# Patient Record
Sex: Female | Born: 1975 | Race: Black or African American | Hispanic: No | Marital: Single | Smoking: Current every day smoker
Health system: Southern US, Community
[De-identification: ages and names within clinical notes are randomized; demographics above are authoritative.]

## PROBLEM LIST (undated history)

## (undated) DIAGNOSIS — F419 Anxiety disorder, unspecified: Secondary | ICD-10-CM

## (undated) DIAGNOSIS — F329 Major depressive disorder, single episode, unspecified: Secondary | ICD-10-CM

## (undated) DIAGNOSIS — F32A Depression, unspecified: Secondary | ICD-10-CM

## (undated) DIAGNOSIS — K219 Gastro-esophageal reflux disease without esophagitis: Secondary | ICD-10-CM

## (undated) HISTORY — DX: Major depressive disorder, single episode, unspecified: F32.9

## (undated) HISTORY — PX: CYST REMOVAL HAND: SHX6279

## (undated) HISTORY — DX: Anxiety disorder, unspecified: F41.9

## (undated) HISTORY — DX: Depression, unspecified: F32.A

## (undated) HISTORY — PX: TUBAL LIGATION: SHX77

## (undated) HISTORY — DX: Gastro-esophageal reflux disease without esophagitis: K21.9

---

## 1998-07-05 ENCOUNTER — Encounter: Admission: RE | Admit: 1998-07-05 | Discharge: 1998-07-05 | Payer: Self-pay | Admitting: Family Medicine

## 1998-07-19 ENCOUNTER — Encounter: Admission: RE | Admit: 1998-07-19 | Discharge: 1998-07-19 | Payer: Self-pay | Admitting: Sports Medicine

## 1998-11-17 ENCOUNTER — Emergency Department (HOSPITAL_COMMUNITY): Admission: EM | Admit: 1998-11-17 | Discharge: 1998-11-17 | Payer: Self-pay | Admitting: Emergency Medicine

## 1999-04-25 ENCOUNTER — Emergency Department (HOSPITAL_COMMUNITY): Admission: EM | Admit: 1999-04-25 | Discharge: 1999-04-25 | Payer: Self-pay | Admitting: Emergency Medicine

## 1999-06-21 ENCOUNTER — Emergency Department (HOSPITAL_COMMUNITY): Admission: EM | Admit: 1999-06-21 | Discharge: 1999-06-21 | Payer: Self-pay | Admitting: Emergency Medicine

## 2000-09-12 ENCOUNTER — Encounter: Admission: RE | Admit: 2000-09-12 | Discharge: 2000-09-12 | Payer: Self-pay | Admitting: Family Medicine

## 2001-04-22 ENCOUNTER — Inpatient Hospital Stay (HOSPITAL_COMMUNITY): Admission: AD | Admit: 2001-04-22 | Discharge: 2001-04-22 | Payer: Self-pay | Admitting: *Deleted

## 2002-06-30 ENCOUNTER — Inpatient Hospital Stay (HOSPITAL_COMMUNITY): Admission: AD | Admit: 2002-06-30 | Discharge: 2002-06-30 | Payer: Self-pay | Admitting: *Deleted

## 2002-09-30 ENCOUNTER — Emergency Department (HOSPITAL_COMMUNITY): Admission: EM | Admit: 2002-09-30 | Discharge: 2002-10-01 | Payer: Self-pay | Admitting: Emergency Medicine

## 2002-11-16 ENCOUNTER — Encounter: Admission: RE | Admit: 2002-11-16 | Discharge: 2002-11-16 | Payer: Self-pay | Admitting: Family Medicine

## 2003-02-09 ENCOUNTER — Other Ambulatory Visit: Admission: RE | Admit: 2003-02-09 | Discharge: 2003-02-09 | Payer: Self-pay | Admitting: Obstetrics and Gynecology

## 2003-02-12 ENCOUNTER — Encounter: Admission: RE | Admit: 2003-02-12 | Discharge: 2003-02-12 | Payer: Self-pay | Admitting: Family Medicine

## 2003-06-25 ENCOUNTER — Emergency Department (HOSPITAL_COMMUNITY): Admission: EM | Admit: 2003-06-25 | Discharge: 2003-06-25 | Payer: Self-pay | Admitting: Emergency Medicine

## 2003-10-18 ENCOUNTER — Other Ambulatory Visit: Admission: RE | Admit: 2003-10-18 | Discharge: 2003-10-18 | Payer: Self-pay | Admitting: Obstetrics and Gynecology

## 2004-01-20 ENCOUNTER — Inpatient Hospital Stay (HOSPITAL_COMMUNITY): Admission: AD | Admit: 2004-01-20 | Discharge: 2004-01-20 | Payer: Self-pay | Admitting: Obstetrics and Gynecology

## 2004-04-30 ENCOUNTER — Inpatient Hospital Stay (HOSPITAL_COMMUNITY): Admission: AD | Admit: 2004-04-30 | Discharge: 2004-05-01 | Payer: Self-pay | Admitting: Obstetrics

## 2004-05-02 ENCOUNTER — Inpatient Hospital Stay (HOSPITAL_COMMUNITY): Admission: AD | Admit: 2004-05-02 | Discharge: 2004-05-04 | Payer: Self-pay | Admitting: Obstetrics

## 2004-05-03 ENCOUNTER — Encounter (INDEPENDENT_AMBULATORY_CARE_PROVIDER_SITE_OTHER): Payer: Self-pay | Admitting: *Deleted

## 2008-11-28 ENCOUNTER — Emergency Department (HOSPITAL_COMMUNITY): Admission: EM | Admit: 2008-11-28 | Discharge: 2008-11-29 | Payer: Self-pay | Admitting: Emergency Medicine

## 2011-02-14 ENCOUNTER — Other Ambulatory Visit (HOSPITAL_COMMUNITY)
Admission: RE | Admit: 2011-02-14 | Discharge: 2011-02-14 | Disposition: A | Discharge status: Home / Self Care | Payer: BC Managed Care – PPO | Source: Ambulatory Visit | Attending: Family Medicine | Admitting: Family Medicine

## 2011-02-14 ENCOUNTER — Other Ambulatory Visit: Payer: Self-pay | Admitting: Family Medicine

## 2011-02-14 ENCOUNTER — Encounter: Payer: Self-pay | Admitting: Family Medicine

## 2011-02-14 ENCOUNTER — Ambulatory Visit (INDEPENDENT_AMBULATORY_CARE_PROVIDER_SITE_OTHER): Payer: BC Managed Care – PPO | Admitting: Family Medicine

## 2011-02-14 DIAGNOSIS — E785 Hyperlipidemia, unspecified: Secondary | ICD-10-CM

## 2011-02-14 DIAGNOSIS — Z0000 Encounter for general adult medical examination without abnormal findings: Secondary | ICD-10-CM

## 2011-02-14 DIAGNOSIS — Z23 Encounter for immunization: Secondary | ICD-10-CM

## 2011-02-14 DIAGNOSIS — R7309 Other abnormal glucose: Secondary | ICD-10-CM

## 2011-02-14 DIAGNOSIS — F17200 Nicotine dependence, unspecified, uncomplicated: Secondary | ICD-10-CM | POA: Insufficient documentation

## 2011-02-14 DIAGNOSIS — Z01419 Encounter for gynecological examination (general) (routine) without abnormal findings: Secondary | ICD-10-CM | POA: Insufficient documentation

## 2011-02-14 LAB — CBC WITH DIFFERENTIAL/PLATELET
Basophils Absolute: 0 10*3/uL (ref 0.0–0.1)
Basophils Relative: 0.5 % (ref 0.0–3.0)
Eosinophils Absolute: 0.1 10*3/uL (ref 0.0–0.7)
HCT: 37.4 % (ref 36.0–46.0)
Hemoglobin: 12.7 g/dL (ref 12.0–15.0)
Lymphocytes Relative: 45.8 % (ref 12.0–46.0)
Lymphs Abs: 3.2 10*3/uL (ref 0.7–4.0)
MCHC: 33.8 g/dL (ref 30.0–36.0)
MCV: 90.2 fl (ref 78.0–100.0)
Monocytes Absolute: 0.5 10*3/uL (ref 0.1–1.0)
Neutro Abs: 3.1 10*3/uL (ref 1.4–7.7)
RBC: 4.15 Mil/uL (ref 3.87–5.11)
RDW: 13.3 % (ref 11.5–14.6)

## 2011-02-14 LAB — CONVERTED CEMR LAB
Glucose, Urine, Semiquant: NEGATIVE
Protein, U semiquant: NEGATIVE
Urobilinogen, UA: 0.2
WBC Urine, dipstick: NEGATIVE
pH: 5

## 2011-02-14 LAB — MICROALBUMIN / CREATININE URINE RATIO
Creatinine,U: 257.7 mg/dL
Microalb Creat Ratio: 0.4 mg/g (ref 0.0–30.0)

## 2011-02-14 LAB — TSH: TSH: 2.25 u[IU]/mL (ref 0.35–5.50)

## 2011-02-14 LAB — LIPID PANEL
Total CHOL/HDL Ratio: 6
Triglycerides: 110 mg/dL (ref 0.0–149.0)

## 2011-02-14 LAB — HEPATIC FUNCTION PANEL
Albumin: 4 g/dL (ref 3.5–5.2)
Alkaline Phosphatase: 46 U/L (ref 39–117)
Total Protein: 6.9 g/dL (ref 6.0–8.3)

## 2011-02-14 LAB — BASIC METABOLIC PANEL
CO2: 28 mEq/L (ref 19–32)
Calcium: 9 mg/dL (ref 8.4–10.5)
Chloride: 101 mEq/L (ref 96–112)
Glucose, Bld: 77 mg/dL (ref 70–99)
Potassium: 3.9 mEq/L (ref 3.5–5.1)
Sodium: 136 mEq/L (ref 135–145)

## 2011-02-16 DIAGNOSIS — R8789 Other abnormal findings in specimens from female genital organs: Secondary | ICD-10-CM | POA: Insufficient documentation

## 2011-02-20 NOTE — Assessment & Plan Note (Signed)
Summary: new to estab/cbs   Vital Signs:  Patient profile:   35 year old female Menstrual status:  regular LMP:     02/07/2011 Height:      65 inches Weight:      183.4 pounds BMI:     30.63 Pulse rate:   76 / minute Pulse rhythm:   regular BP sitting:   132 / 80  (left arm) Cuff size:   large  Vitals Entered By: Almeta Monas CMA Duncan Dull) (February 14, 2011 10:39 AM) CC: New Est CPX with pap---wants to be tested for Diabetes  and Hyperlipidemia per pt--labs from work have been abnl LMP (date): 02/07/2011     Menstrual Status regular Enter LMP: 02/07/2011   History of Present Illness: Pt is here for cpe and pap.  Pt has hx elevated glucose and cholesterol.  Pt has been drinking a lot more water and going to [place] a lot.    Preventive Screening-Counseling & Management  Alcohol-Tobacco     Alcohol drinks/day: <1     Alcohol type: wine     Smoking Status: current     Smoking Cessation Counseling: yes     Smoke Cessation Stage: not ready     Packs/Day: 1.0     Year Started: 2004  Caffeine-Diet-Exercise     Caffeine use/day: 1     Does Patient Exercise: no  Hep-HIV-STD-Contraception     Dental Visit-last 6 months no     SBE monthly: yes     SBE Education/Counseling: not indicated; SBE done regularly      Sexual History:  not currently sexually active.        Drug Use:  no.    Current Medications (verified): 1)  None  Allergies (verified): No Known Drug Allergies  Past History:  Family History: Last updated: 02/14/2011 Family History Diabetes 1st degree relative-- paternal GM Family History Lung cancer-- Maternal GM  Social History: Last updated: 02/14/2011 Occupation:  [employer]  Single Current Smoker Alcohol use-yes Drug use-no Regular exercise-no  Risk Factors: Alcohol Use: <1 (02/14/2011) Caffeine Use: 1 (02/14/2011) Exercise: no (02/14/2011)  Risk Factors: Smoking Status: current (02/14/2011) Packs/Day: 1.0 (02/14/2011)  Past Medical  History: Current Problems:  PREVENTIVE HEALTH CARE (ICD-V70.0) HYPERLIPIDEMIA (ICD-272.4) HYPERGLYCEMIA, FASTING (ICD-790.29) FAMILY HISTORY DIABETES 1ST DEGREE RELATIVE (ICD-V18.0)  Past Surgical History: Tubal ligation  Family History: Reviewed history and no changes required. Family History Diabetes 1st degree relative-- paternal GM Family History Lung cancer-- Maternal GM  Social History: Reviewed history and no changes required. Occupation:  [employer]  Single Current Smoker Alcohol use-yes Drug use-no Regular exercise-no Occupation:  employed Smoking Status:  current Drug Use:  no Does Patient Exercise:  no Packs/Day:  1.0 Caffeine use/day:  1 Dental Care w/in 6 mos.:  no Sexual History:  not currently sexually active  Review of Systems      See HPI General:  Denies chills, fatigue, fever, loss of appetite, malaise, sleep disorder, sweats, weakness, and weight loss. Eyes:  Denies blurring, discharge, double vision, eye irritation, eye pain, halos, itching, light sensitivity, red eye, vision loss-1 eye, and vision loss-both eyes; optho--q1y. ENT:  Denies decreased hearing, difficulty swallowing, ear discharge, earache, hoarseness, nasal congestion, nosebleeds, postnasal drainage, ringing in ears, sinus pressure, and sore throat. CV:  Denies bluish discoloration of lips or nails, chest pain or discomfort, difficulty breathing at night, difficulty breathing while lying down, fainting, fatigue, leg cramps with exertion, lightheadness, near fainting, palpitations, shortness of breath with exertion, swelling of  feet, swelling of hands, and weight gain. Resp:  Denies chest discomfort, chest pain with inspiration, cough, coughing up blood, excessive snoring, hypersomnolence, morning headaches, pleuritic, shortness of breath, sputum productive, and wheezing. GI:  Denies abdominal pain, bloody stools, change in bowel habits, constipation, dark tarry stools, diarrhea,  excessive appetite, gas, hemorrhoids, indigestion, loss of appetite, nausea, vomiting, vomiting blood, and yellowish skin color. GU:  Complains of urinary frequency; denies abnormal vaginal bleeding, decreased libido, discharge, dysuria, genital sores, hematuria, incontinence, nocturia, and urinary hesitancy. MS:  Denies joint pain, joint redness, joint swelling, loss of strength, low back pain, mid back pain, muscle aches, muscle , cramps, muscle weakness, stiffness, and thoracic pain. Derm:  Denies changes in color of skin, changes in nail beds, dryness, excessive perspiration, flushing, hair loss, insect bite(s), itching, lesion(s), poor wound healing, and rash. Neuro:  Denies brief paralysis, difficulty with concentration, disturbances in coordination, falling down, headaches, inability to speak, memory loss, numbness, poor balance, seizures, sensation of room spinning, tingling, tremors, visual disturbances, and weakness. Psych:  Denies alternate hallucination ( auditory/visual), anxiety, depression, easily angered, easily tearful, irritability, mental problems, panic attacks, sense of great danger, suicidal thoughts/plans, thoughts of violence, unusual visions or sounds, and thoughts /plans of harming others. Endo:  Complains of excessive thirst and excessive urination; denies cold intolerance, excessive hunger, heat intolerance, polyuria, and weight change. Heme:  Denies abnormal bruising, bleeding, enlarge lymph nodes, fevers, pallor, and skin discoloration. Allergy:  Denies hives or rash, itching eyes, persistent infections, seasonal allergies, and sneezing.  Physical Exam  General:  Well-developed,well-nourished,in no acute distress; alert,appropriate and cooperative throughout examination Head:  Normocephalic and atraumatic without obvious abnormalities. No apparent alopecia or balding. Eyes:  pupils equal, pupils round, pupils reactive to light, and no injection.   Ears:  External ear exam  shows no significant lesions or deformities.  Otoscopic examination reveals clear canals, tympanic membranes are intact bilaterally without bulging, retraction, inflammation or discharge. Hearing is grossly normal bilaterally. Nose:  External nasal examination shows no deformity or inflammation. Nasal mucosa are pink and moist without lesions or exudates. Mouth:  Oral mucosa and oropharynx without lesions or exudates.  Teeth in good repair. Neck:  No deformities, masses, or tenderness noted. Breasts:  No mass, nodules, thickening, tenderness, bulging, retraction, inflamation, nipple discharge or skin changes noted.   Lungs:  Normal respiratory effort, chest expands symmetrically. Lungs are clear to auscultation, no crackles or wheezes. Heart:  Normal rate and regular rhythm. S1 and S2 normal without gallop, murmur, click, rub or other extra sounds. Abdomen:  Bowel sounds positive,abdomen soft and non-tender without masses, organomegaly or hernias noted. Msk:  No deformity or scoliosis noted of thoracic or lumbar spine.   Pulses:  R and L carotid,radial,femoral,dorsalis pedis and posterior tibial pulses are full and equal bilaterally Extremities:  No clubbing, cyanosis, edema, or deformity noted with normal full range of motion of all joints.   Neurologic:  No cranial nerve deficits noted. Station and gait are normal. Plantar reflexes are down-going bilaterally. DTRs are symmetrical throughout. Sensory, motor and coordinative functions appear intact. Skin:  Intact without suspicious lesions or rashes Cervical Nodes:  No lymphadenopathy noted Axillary Nodes:  No palpable lymphadenopathy Psych:  Cognition and judgment appear intact. Alert and cooperative with normal attention span and concentration. No apparent delusions, illusions, hallucinations   Impression & Recommendations:  Problem # 1:  PREVENTIVE HEALTH CARE (ICD-V70.0) ghm utd Orders: Venipuncture (91478) TLB-Lipid Panel  (80061-LIPID) TLB-BMP (Basic Metabolic Panel-BMET) (80048-METABOL) TLB-CBC Platelet -  w/Differential (85025-CBCD) TLB-Hepatic/Liver Function Pnl (80076-HEPATIC) TLB-TSH (Thyroid Stimulating Hormone) (84443-TSH) TLB-Microalbumin/Creat Ratio, Urine (82043-MALB) TLB-A1C / Hgb A1C (Glycohemoglobin) (83036-A1C) Specimen Handling (16109)  Problem # 2:  HYPERLIPIDEMIA (ICD-272.4)  Orders: Tobacco use cessation intermediate 3-10 minutes (99406) Venipuncture (60454) TLB-Lipid Panel (80061-LIPID) TLB-BMP (Basic Metabolic Panel-BMET) (80048-METABOL) TLB-CBC Platelet - w/Differential (85025-CBCD) TLB-Hepatic/Liver Function Pnl (80076-HEPATIC) TLB-TSH (Thyroid Stimulating Hormone) (84443-TSH) TLB-Microalbumin/Creat Ratio, Urine (82043-MALB) TLB-A1C / Hgb A1C (Glycohemoglobin) (83036-A1C) Specimen Handling (09811)  Problem # 3:  HYPERGLYCEMIA, FASTING (ICD-790.29)  Orders: Venipuncture (91478) TLB-Lipid Panel (80061-LIPID) TLB-BMP (Basic Metabolic Panel-BMET) (80048-METABOL) TLB-CBC Platelet - w/Differential (85025-CBCD) TLB-Hepatic/Liver Function Pnl (80076-HEPATIC) TLB-TSH (Thyroid Stimulating Hormone) (84443-TSH) TLB-Microalbumin/Creat Ratio, Urine (82043-MALB) TLB-A1C / Hgb A1C (Glycohemoglobin) (83036-A1C) Specimen Handling (29562)  Problem # 4:  TOBACCO USE (ICD-305.1)  Encouraged smoking cessation and discussed different methods for smoking cessation.   Other Orders: Tdap => 21yrs IM (13086) Admin 1st Vaccine (57846)   Orders Added: 1)  Tdap => 59yrs IM [90715] 2)  Admin 1st Vaccine [90471] 3)  Tobacco use cessation intermediate 3-10 minutes [99406] 4)  Venipuncture [36415] 5)  TLB-Lipid Panel [80061-LIPID] 6)  TLB-BMP (Basic Metabolic Panel-BMET) [80048-METABOL] 7)  TLB-CBC Platelet - w/Differential [85025-CBCD] 8)  TLB-Hepatic/Liver Function Pnl [80076-HEPATIC] 9)  TLB-TSH (Thyroid Stimulating Hormone) [84443-TSH] 10)  TLB-Microalbumin/Creat Ratio, Urine  [82043-MALB] 11)  TLB-A1C / Hgb A1C (Glycohemoglobin) [83036-A1C] 12)  Specimen Handling [99000]   Immunizations Administered:  Tetanus Vaccine:    Vaccine Type: Tdap    Site: right deltoid    Mfr: Merck    Dose: 0.5 ml    Route: IM    Given by: Almeta Monas CMA (AAMA)    Exp. Date: 10/26/2012    Lot #: [id number]    VIS given: 10/20/08 version given February 14, 2011.   Immunizations Administered:  Tetanus Vaccine:    Vaccine Type: Tdap    Site: right deltoid    Mfr: Merck    Dose: 0.5 ml    Route: IM    Given by: Almeta Monas CMA (AAMA)    Exp. Date: 10/26/2012    Lot #: [id number]    VIS given: 10/20/08 version given February 14, 2011.  Laboratory Results   Urine Tests    Routine Urinalysis   Color: yellow Appearance: Hazy Glucose: negative   (Normal Range: Negative) Bilirubin: small   (Normal Range: Negative) Ketone: negative   (Normal Range: Negative) Spec. Gravity: 1.025   (Normal Range: 1.003-1.035) Blood: trace-lysed   (Normal Range: Negative) pH: 5.0   (Normal Range: 5.0-8.0) Protein: negative   (Normal Range: Negative) Urobilinogen: 0.2   (Normal Range: 0-1) Nitrite: negative   (Normal Range: Negative) Leukocyte Esterace: negative   (Normal Range: Negative)         Flu Vaccine Next Due:  Refused TD Result Date:  02/14/2011 TD Result:  given TD Next Due:  10 yr

## 2011-03-27 ENCOUNTER — Other Ambulatory Visit: Payer: Self-pay | Admitting: Obstetrics and Gynecology

## 2011-04-20 NOTE — Op Note (Signed)
Sheila Scott, Sheila Scott                       ACCOUNT NO.:  [id number]   MEDICAL RECORD NO.:  [id number]                   PATIENT TYPE:  INP   LOCATION:  9143                                 FACILITY:  [facility]   PHYSICIAN:  Charles A. Clearance Coots, M.D.             DATE OF BIRTH:  27-May-1976   DATE OF PROCEDURE:  05/03/2004  DATE OF DISCHARGE:                                 OPERATIVE REPORT   PREOPERATIVE DIAGNOSIS:  Desires sterilization.   POSTOPERATIVE DIAGNOSIS:  Desires sterilization.   PROCEDURE:  Bilateral partial salpingectomy (Pomeroy technique).   SURGEON:  Charles A. Clearance Coots, M.D.   ANESTHESIA:  Epidural/local.   SPECIMENS:  Approximately 2 cm segments of right and left fallopian tubes.   OPERATION:  The patient was brought to the operating room and after  satisfactory redosing of the epidural and local infiltration of 2% Xylocaine  in the incision area, the abdomen was prepped and draped in the usual  sterile fashion.  A small inferior umbilical incision was made with a  scalpel that was deepened down to the fascia with curved Mayo scissors.  The  fascia was grasped in the midline with Kocher forceps and cut transversely  with curved Mayo scissors down to the peritoneum.  The fascial incision was  extended to the left and the right with curved Mayo scissors.  Right angle  retractors were placed in the incision and the right fallopian tube was  identified and grasped with a Babcock clamp.  It was followed from the  cornual end to the fimbrial end and grasped with Babcock clamps and then  followed retrograde back to the isthmic area of the tube.  A knuckle of tube  beneath the Babcock clamp and the isthmic area of the tube was doubly  ligated with #1 plain catgut and a section of tube above the knot was  excised and submitted to pathology for evaluation.  The same procedure was  performed on the opposite side without complications.  The peritoneum and  fascia were closed as  one with continuous suture of 2-0 Vicryl.  The  subcutaneous tissue was reapproximated with interrupted sutures of 2-0  Vicryl.  The skin was closed with continuous subcuticular suture of 4-0  Monocryl.  A sterile bandage was applied to the incision closure.  The  surgical technician indicated that all needle, sponge, and instrument counts  were correct x 2.  The patient tolerated the procedure well and was  transferred to the recovery room in satisfactory condition.                                               Charles A. Clearance Coots, M.D.    CAH/MEDQ  D:  05/03/2004  T:  05/03/2004  Job:  [id number]

## 2011-04-20 NOTE — Discharge Summary (Signed)
Sheila Scott, Sheila Scott                       ACCOUNT NO.:  [id number]   MEDICAL RECORD NO.:  [id number]                   PATIENT TYPE:  INP   LOCATION:  9143                                 FACILITY:  [facility]   PHYSICIAN:  Charles A. Clearance Coots, M.D.             DATE OF BIRTH:  Jan 06, 1976   DATE OF ADMISSION:  05/02/2004  DATE OF DISCHARGE:  05/04/2004                                 DISCHARGE SUMMARY   ADMITTING DIAGNOSES:  1. Thirty-seven and six-sevenths weeks gestation.  2. Active labor.  3. Desired permanent sterilization.   DISCHARGE DIAGNOSES:  1. Thirty-seven and six-sevenths weeks gestation.  2. Active labor.  3. Desired permanent sterilization.  4. Status post normal spontaneous vaginal delivery on May 02, 2004 at 1919,     a viable female infant with Apgars of 8 at one minute and 9 at five     minutes; weight of 2885 g, length of 49 cm.  5. Status post postpartum tubal ligation on postpartum day #1.  The patient     and infant discharged home in good condition.   REASON FOR ADMISSION:  A 35 year old black female G2 P1 with estimated date  of confinement May 17, 2004 presents with regular strong uterine  contractions.  Denies rupture of membranes, fever, chills, or dysuria.  Prenatal care was uncomplicated with group B strep positive culture.   PAST MEDICAL HISTORY:  Surgery:  None.  Illnesses:  None.   MEDICATIONS:  Prenatal vitamins.   ALLERGIES:  No known drug allergies.   SOCIAL HISTORY:  Single.  Positive tobacco.  Negative alcohol or  recreational drug use.   PHYSICAL EXAMINATION:  GENERAL:  Well-nourished, well-developed black female  in no acute distress.  VITAL SIGNS:  Temperature 98, pulse 80, respiratory rate 20, blood pressure  131/76.  LUNGS:  Clear to auscultation bilaterally.  HEART:  Regular rate and rhythm.  ABDOMEN:  Gravid, nontender.  PELVIC:  Cervix 4 cm dilated, 100% effaced, vertex at a -1 station.   ADMITTING LABORATORY VALUES:  Hemoglobin  10.9; hematocrit 33.2; white blood  cell count 14,000; platelets 261,000.   HOSPITAL COURSE:  The patient was admitted and progressed to normal  spontaneous vaginal delivery of a viable female infant without complications.  Postpartum course was uncomplicated and on postpartum day #1 the patient was  taken to the operating room for a postpartum tubal ligation.  There were no  intraoperative complications.  The patient was discharged home on postpartum  day #2 in good condition.   DISCHARGE LABORATORY VALUES:  Hemoglobin 9.2; hematocrit 27.6; white blood  cell count 18,000; platelets 220,000.   DISCHARGE DISPOSITION:  1. Medications:  Tylox and ibuprofen prescribed for pain; continue prenatal     vitamins.  2. Routine written instructions were given for obstetrical discharge.  3. The patient is to follow up in the office in 6 weeks.  Charles A. Clearance Coots, M.D.    CAH/MEDQ  D:  05/04/2004  T:  05/04/2004  Job:  [id number]

## 2011-09-07 LAB — URINALYSIS, ROUTINE W REFLEX MICROSCOPIC
Glucose, UA: NEGATIVE mg/dL
Hgb urine dipstick: NEGATIVE
Protein, ur: NEGATIVE mg/dL
Specific Gravity, Urine: 1 — ABNORMAL LOW (ref 1.005–1.030)
pH: 6.5 (ref 5.0–8.0)

## 2011-09-07 LAB — URINE MICROSCOPIC-ADD ON

## 2012-08-06 ENCOUNTER — Ambulatory Visit: Payer: BC Managed Care – PPO

## 2012-08-06 ENCOUNTER — Ambulatory Visit (INDEPENDENT_AMBULATORY_CARE_PROVIDER_SITE_OTHER): Payer: BC Managed Care – PPO | Admitting: Emergency Medicine

## 2012-08-06 ENCOUNTER — Other Ambulatory Visit: Payer: Self-pay | Admitting: Emergency Medicine

## 2012-08-06 VITALS — BP 132/81 | HR 74 | Temp 98.8°F | Resp 16 | Ht 64.5 in | Wt 182.0 lb

## 2012-08-06 DIAGNOSIS — M549 Dorsalgia, unspecified: Secondary | ICD-10-CM

## 2012-08-06 DIAGNOSIS — R109 Unspecified abdominal pain: Secondary | ICD-10-CM

## 2012-08-06 LAB — POCT CBC
HCT, POC: 42.6 % (ref 37.7–47.9)
Hemoglobin: 12.9 g/dL (ref 12.2–16.2)
Lymph, poc: 2.1 (ref 0.6–3.4)
MCH, POC: 28 pg (ref 27–31.2)
MCHC: 30.3 g/dL — AB (ref 31.8–35.4)
MCV: 92.3 fL (ref 80–97)
WBC: 8.2 10*3/uL (ref 4.6–10.2)

## 2012-08-06 LAB — COMPREHENSIVE METABOLIC PANEL
Albumin: 4.6 g/dL (ref 3.5–5.2)
CO2: 28 mEq/L (ref 19–32)
Glucose, Bld: 87 mg/dL (ref 70–99)
Potassium: 4.5 mEq/L (ref 3.5–5.3)
Sodium: 139 mEq/L (ref 135–145)
Total Bilirubin: 0.5 mg/dL (ref 0.3–1.2)
Total Protein: 7.5 g/dL (ref 6.0–8.3)

## 2012-08-06 LAB — POCT UA - MICROSCOPIC ONLY
RBC, urine, microscopic: NEGATIVE
WBC, Ur, HPF, POC: NEGATIVE

## 2012-08-06 LAB — POCT URINALYSIS DIPSTICK
Bilirubin, UA: NEGATIVE
Blood, UA: NEGATIVE
Glucose, UA: NEGATIVE
Ketones, UA: NEGATIVE
pH, UA: 8.5

## 2012-08-06 LAB — AMYLASE: Amylase: 31 U/L (ref 0–105)

## 2012-08-06 LAB — LIPASE: Lipase: 26 U/L (ref 0–75)

## 2012-08-06 MED ORDER — METHOCARBAMOL 750 MG PO TABS: 750.0000 mg | ORAL_TABLET | Freq: Four times a day (QID) | ORAL | Status: AC

## 2012-08-06 NOTE — Progress Notes (Signed)
  Subjective:    Patient ID: Sheila Scott, female    DOB: 1976-05-19, 36 y.o.   MRN: [id number]  HPI said 2 days ago she started pain on the right side or back. She has a lot of pain with moving turning she does not in pain when she takes a deep breath. She has not had any urinary symptoms. She has tried some ibuprofen without results. She does have family history of kidney stones and has some concerns about that.    Review of Systems     Objective:   Physical Exam of breath sounds are symmetrical. There is no CVA pain noted. There is no peri-lumbar or SI joint pain. Her deep tendon reflexes are symmetrical straight leg raising is negative. The abdomen is soft there is tenderness to palpation deep in the right upper abdomen without rebound.        Assessment & Plan:   Results for orders placed in visit on 08/06/12  POCT CBC      Component Value Range   WBC 8.2  4.6 - 10.2 K/uL   Lymph, poc 2.1  0.6 - 3.4   POC LYMPH PERCENT 25.8  10 - 50 %L   MID (cbc) 0.5  0 - 0.9   POC MID % 6.1  0 - 12 %M   POC Granulocyte 5.6  2 - 6.9   Granulocyte percent 68.1  37 - 80 %G   RBC 4.61  4.04 - 5.48 M/uL   Hemoglobin 12.9  12.2 - 16.2 g/dL   HCT, POC 84.6  96.2 - 47.9 %   MCV 92.3  80 - 97 fL   MCH, POC 28.0  27 - 31.2 pg   MCHC 30.3 (*) 31.8 - 35.4 g/dL   RDW, POC 95.2     Platelet Count, POC 310  142 - 424 K/uL   MPV 8.4  0 - 99.8 fL  POCT URINALYSIS DIPSTICK      Component Value Range   Color, UA yellow     Clarity, UA clear     Glucose, UA negative     Bilirubin, UA negative     Ketones, UA negative     Spec Grav, UA 1.015     Blood, UA negative     pH, UA 8.5     Protein, UA negative     Urobilinogen, UA 0.2     Nitrite, UA negative     Leukocytes, UA Negative    POCT UA - MICROSCOPIC ONLY      Component Value Range   WBC, Ur, HPF, POC negative     RBC, urine, microscopic negative     Bacteria, U Microscopic negative     Mucus, UA negative     Epithelial cells,  urine per micros 0-1     Crystals, Ur, HPF, POC negative     Casts, Ur, LPF, POC negative     Yeast, UA negative    [facility] reading (PRIMARY) by  Dr. Cleta Alberts KUB shows a good amount of stool but no acute changes  Plan treat patient with the Robaxin and she is to take 1-2 Aleve twice a day she was given a note out of work until Sat.

## 2013-02-02 ENCOUNTER — Encounter: Payer: BC Managed Care – PPO | Admitting: Medical

## 2013-09-29 ENCOUNTER — Encounter (HOSPITAL_COMMUNITY): Payer: Self-pay | Admitting: Emergency Medicine

## 2013-09-29 ENCOUNTER — Emergency Department (HOSPITAL_COMMUNITY)
Admission: EM | Admit: 2013-09-29 | Discharge: 2013-09-30 | Disposition: A | Discharge status: Home / Self Care | Payer: BC Managed Care – PPO | Attending: Emergency Medicine | Admitting: Emergency Medicine

## 2013-09-29 DIAGNOSIS — M549 Dorsalgia, unspecified: Secondary | ICD-10-CM

## 2013-09-29 DIAGNOSIS — Z3202 Encounter for pregnancy test, result negative: Secondary | ICD-10-CM | POA: Insufficient documentation

## 2013-09-29 DIAGNOSIS — F17200 Nicotine dependence, unspecified, uncomplicated: Secondary | ICD-10-CM | POA: Insufficient documentation

## 2013-09-29 DIAGNOSIS — R1032 Left lower quadrant pain: Secondary | ICD-10-CM | POA: Insufficient documentation

## 2013-09-29 DIAGNOSIS — M545 Low back pain, unspecified: Secondary | ICD-10-CM | POA: Insufficient documentation

## 2013-09-29 NOTE — ED Notes (Signed)
Pt reports L flank pain radiating to abdomen since awakening this morning, reports Ibuprofen taken without relief, reports her menstrual cycle is not like her usual as well, and is lighter than usual.

## 2013-09-30 ENCOUNTER — Emergency Department (HOSPITAL_COMMUNITY): Payer: BC Managed Care – PPO

## 2013-09-30 LAB — URINALYSIS, ROUTINE W REFLEX MICROSCOPIC
Ketones, ur: NEGATIVE mg/dL
Nitrite: NEGATIVE
Protein, ur: NEGATIVE mg/dL
Urobilinogen, UA: 0.2 mg/dL (ref 0.0–1.0)

## 2013-09-30 LAB — BASIC METABOLIC PANEL
Calcium: 9 mg/dL (ref 8.4–10.5)
Chloride: 102 mEq/L (ref 96–112)
Creatinine, Ser: 0.76 mg/dL (ref 0.50–1.10)
GFR calc Af Amer: >90 mL/min (ref >90)
Sodium: 135 mEq/L (ref 135–145)

## 2013-09-30 LAB — CBC WITH DIFFERENTIAL/PLATELET
Basophils Absolute: 0 10*3/uL (ref 0.0–0.1)
Basophils Relative: 0 % (ref 0–1)
Eosinophils Relative: 1 % (ref 0–5)
HCT: 36.7 % (ref 36.0–46.0)
Hemoglobin: 12.3 g/dL (ref 12.0–15.0)
Lymphs Abs: 3.7 10*3/uL (ref 0.7–4.0)
MCHC: 33.5 g/dL (ref 30.0–36.0)
Monocytes Absolute: 0.5 10*3/uL (ref 0.1–1.0)
Neutro Abs: 4.5 10*3/uL (ref 1.7–7.7)
Neutrophils Relative %: 51 % (ref 43–77)
Platelets: 238 10*3/uL (ref 150–400)
RBC: 4.18 MIL/uL (ref 3.87–5.11)
RDW: 13.3 % (ref 11.5–15.5)

## 2013-09-30 LAB — URINE MICROSCOPIC-ADD ON

## 2013-09-30 MED ORDER — HYDROCODONE-ACETAMINOPHEN 5-325 MG PO TABS: 2.0000 | ORAL_TABLET | ORAL | Status: DC | PRN

## 2013-09-30 MED ORDER — CYCLOBENZAPRINE HCL 10 MG PO TABS: 10.0000 mg | ORAL_TABLET | Freq: Three times a day (TID) | ORAL | Status: DC | PRN

## 2013-09-30 MED ORDER — IBUPROFEN 800 MG PO TABS: 800.0000 mg | ORAL_TABLET | Freq: Three times a day (TID) | ORAL | Status: DC

## 2013-09-30 NOTE — ED Provider Notes (Signed)
CSN: [id number]     Arrival date & time 09/29/13  2336 History   First MD Initiated Contact with Patient 09/29/13 2348     Chief Complaint  Patient presents with  . Flank Pain   (Consider location/radiation/quality/duration/timing/severity/associated sxs/prior Treatment) HPI Comments: She presents to ER with complaints of pain in the left lower back. Patient reports that symptoms have been ongoing for several days, have worsened. Patient reports moderate to severe pain in the left lower part of her back which worsens with certain movements. She denies direct injury. Patient states that the pain started to come around to the front of her abdomen on the left side tonight. No vomiting, diarrhea. She has been trying ibuprofen but it has not helped. She denies urinary symptoms. No change in bowel or bladder function. No numbness, tingling or weakness in lower extremities.  Patient is a 37 y.o. female presenting with flank pain.  Flank Pain Associated symptoms include abdominal pain.    History reviewed. No pertinent past medical history. History reviewed. No pertinent past surgical history. History reviewed. No pertinent family history. History  Substance Use Topics  . Smoking status: Current Every Day Smoker -- 1.00 packs/day    Types: Cigarettes  . Smokeless tobacco: Never Used  . Alcohol Use: Yes     Comment: occ   OB History   Grav Para Term Preterm Abortions TAB SAB Ect Mult Living                 Review of Systems  Gastrointestinal: Positive for abdominal pain.  Genitourinary: Positive for flank pain.  Musculoskeletal: Positive for back pain.  Neurological: Negative.   All other systems reviewed and are negative.    Allergies  Review of patient's allergies indicates no known allergies.  Home Medications   Current Outpatient Rx  Name  Route  Sig  Dispense  Refill  . ibuprofen (ADVIL,MOTRIN) 200 MG tablet   Oral   Take 200 mg by mouth every 6 (six) hours as needed for  pain.          BP 141/85  Pulse 88  Temp(Src) 99.3 F (37.4 C) (Oral)  Resp 16  Ht 5\' 4"  (1.626 m)  Wt 190 lb (86.183 kg)  BMI 32.6 kg/m2  SpO2 100%  LMP 09/29/2013 Physical Exam  Constitutional: She is oriented to person, place, and time. She appears well-developed and well-nourished. No distress.  HENT:  Head: Normocephalic and atraumatic.  Right Ear: Hearing normal.  Left Ear: Hearing normal.  Nose: Nose normal.  Mouth/Throat: Oropharynx is clear and moist and mucous membranes are normal.  Eyes: Conjunctivae and EOM are normal. Pupils are equal, round, and reactive to light.  Neck: Normal range of motion. Neck supple.  Cardiovascular: Regular rhythm, S1 normal and S2 normal.  Exam reveals no gallop and no friction rub.   No murmur heard. Pulmonary/Chest: Effort normal and breath sounds normal. No respiratory distress. She exhibits no tenderness.  Abdominal: Soft. Normal appearance and bowel sounds are normal. There is no hepatosplenomegaly. There is tenderness in the left lower quadrant. There is no rebound, no guarding, no tenderness at McBurney's point and negative Murphy's sign. No hernia.  Musculoskeletal: Normal range of motion.  Neurological: She is alert and oriented to person, place, and time. She has normal strength. No cranial nerve deficit or sensory deficit. Coordination normal. GCS eye subscore is 4. GCS verbal subscore is 5. GCS motor subscore is 6.  Skin: Skin is warm, dry and intact. No  rash noted. No cyanosis.  Psychiatric: She has a normal mood and affect. Her speech is normal and behavior is normal. Thought content normal.    ED Course  Procedures (including critical care time) Labs Review Labs Reviewed  URINALYSIS, ROUTINE W REFLEX MICROSCOPIC - Abnormal; Notable for the following:    Hgb urine dipstick LARGE (*)    Leukocytes, UA SMALL (*)    All other components within normal limits  CBC WITH DIFFERENTIAL  BASIC METABOLIC PANEL  URINE  MICROSCOPIC-ADD ON  PREGNANCY, URINE   Imaging Review Ct Abdomen Pelvis Wo Contrast  09/30/2013   CLINICAL DATA:  Left flank pain.  EXAM: CT ABDOMEN AND PELVIS WITHOUT CONTRAST  TECHNIQUE: Multidetector CT imaging of the abdomen and pelvis was performed following the standard protocol without intravenous contrast.  COMPARISON:  Abdominal radiograph.  August 06, 2012  FINDINGS: Included view of the lung bases are clear. The visualized heart and pericardium are unremarkable.  The liver, spleen, gallbladder, pancreas and adrenal glands are unremarkable for this noncontrast examination.  The stomach, small and large bowel are normal in course and caliber without inflammatory changes, the sensitivity may be decreased by lack of enteric contrast. Normal appendix. No intraperitoneal free fluid nor free air.  Kidneys are orthotopic, demonstrating symmetric enhancement without nephrolithiasis, hydronephrosis; limited assessment for renal masses on this nonenhanced examination. The unopacified ureters are normal in course and caliber. Urinary bladder is partially distended unremarkable.  Great vessels are normal in course and caliber. No lymphadenopathy by CT size criteria. Internal reproductive organs are unremarkable. The soft tissues and included osseous structures are nonsuspicious. Mild bilateral sacroiliac osteoarthrosis.  IMPRESSION: No urolithiasis nor acute intra-abdominal/ pelvic process.   Electronically Signed   By: Awilda Metro   On: 09/30/2013 02:37    EKG Interpretation   None       MDM  Diagnosis: Back pain  Patient presents to ER with complaints of predominantly low back pain. Patient's pain appears to be musculoskeletal in nature. She has some tenderness in the left soft tissues and pain worsens with bending and twisting. She denies injury. She has a normal neurologic exam. Patient now complaining of pain radiating around into the left groin area. Because of this, as well as the fact  that she had mild tenderness without guarding and rebound in the left lower quadrant a CAT scan was performed. No evidence of diverticulitis, no ureterolithiasis. CAT scan shows no acute pathology. Patient will be treated with analgesia and rest.    Gilda Crease, MD 09/30/13 [phone]

## 2013-10-07 ENCOUNTER — Encounter: Payer: Self-pay | Admitting: Family Medicine

## 2013-10-07 ENCOUNTER — Ambulatory Visit (INDEPENDENT_AMBULATORY_CARE_PROVIDER_SITE_OTHER): Payer: BC Managed Care – PPO | Admitting: Family Medicine

## 2013-10-07 ENCOUNTER — Ambulatory Visit (INDEPENDENT_AMBULATORY_CARE_PROVIDER_SITE_OTHER)
Admission: RE | Admit: 2013-10-07 | Discharge: 2013-10-07 | Disposition: A | Discharge status: Home / Self Care | Payer: BC Managed Care – PPO | Source: Ambulatory Visit | Attending: Family Medicine | Admitting: Family Medicine

## 2013-10-07 VITALS — BP 118/84 | HR 96 | Temp 98.5°F | Wt 184.2 lb

## 2013-10-07 DIAGNOSIS — M25552 Pain in left hip: Secondary | ICD-10-CM

## 2013-10-07 DIAGNOSIS — M545 Low back pain, unspecified: Secondary | ICD-10-CM

## 2013-10-07 DIAGNOSIS — M25559 Pain in unspecified hip: Secondary | ICD-10-CM

## 2013-10-07 NOTE — Patient Instructions (Signed)
Back Pain, Adult Low back pain is very common. About 1 in 5 people have back pain.The cause of low back pain is rarely dangerous. The pain often gets better over time.About half of people with a sudden onset of back pain feel better in just 2 weeks. About 8 in 10 people feel better by 6 weeks.  CAUSES Some common causes of back pain include:  Strain of the muscles or ligaments supporting the spine.  Wear and tear (degeneration) of the spinal discs.  Arthritis.  Direct injury to the back. DIAGNOSIS Most of the time, the direct cause of low back pain is not known.However, back pain can be treated effectively even when the exact cause of the pain is unknown.Answering your caregiver's questions about your overall health and symptoms is one of the most accurate ways to make sure the cause of your pain is not dangerous. If your caregiver needs more information, he or she may order lab work or imaging tests (X-rays or MRIs).However, even if imaging tests show changes in your back, this usually does not require surgery. HOME CARE INSTRUCTIONS For many people, back pain returns.Since low back pain is rarely dangerous, it is often a condition that people can learn to manageon their own.   Remain active. It is stressful on the back to sit or stand in one place. Do not sit, drive, or stand in one place for more than 30 minutes at a time. Take short walks on level surfaces as soon as pain allows.Try to increase the length of time you walk each day.  Do not stay in bed.Resting more than 1 or 2 days can delay your recovery.  Do not avoid exercise or work.Your body is made to move.It is not dangerous to be active, even though your back may hurt.Your back will likely heal faster if you return to being active before your pain is gone.  Pay attention to your body when you bend and lift. Many people have less discomfortwhen lifting if they bend their knees, keep the load close to their bodies,and  avoid twisting. Often, the most comfortable positions are those that put less stress on your recovering back.  Find a comfortable position to sleep. Use a firm mattress and lie on your side with your knees slightly bent. If you lie on your back, put a pillow under your knees.  Only take over-the-counter or prescription medicines as directed by your caregiver. Over-the-counter medicines to reduce pain and inflammation are often the most helpful.Your caregiver may prescribe muscle relaxant drugs.These medicines help dull your pain so you can more quickly return to your normal activities and healthy exercise.  Put ice on the injured area.  Put ice in a plastic bag.  Place a towel between your skin and the bag.  Leave the ice on for 15-20 minutes, 03-04 times a day for the first 2 to 3 days. After that, ice and heat may be alternated to reduce pain and spasms.  Ask your caregiver about trying back exercises and gentle massage. This may be of some benefit.  Avoid feeling anxious or stressed.Stress increases muscle tension and can worsen back pain.It is important to recognize when you are anxious or stressed and learn ways to manage it.Exercise is a great option. SEEK MEDICAL CARE IF:  You have pain that is not relieved with rest or medicine.  You have pain that does not improve in 1 week.  You have new symptoms.  You are generally not feeling well. SEEK   IMMEDIATE MEDICAL CARE IF:   You have pain that radiates from your back into your legs.  You develop new bowel or bladder control problems.  You have unusual weakness or numbness in your arms or legs.  You develop nausea or vomiting.  You develop abdominal pain.  You feel faint. Document Released: 11/19/2005 Document Revised: 05/20/2012 Document Reviewed: 04/09/2011 [employer] Patient Information 2014 [employer].  

## 2013-10-07 NOTE — Progress Notes (Signed)
  Subjective:    Sheila Scott is a 37 y.o. female who presents for evaluation of low back pain. The patient has had no prior back problems. Symptoms have been present for several days and are unchanged.  Onset was related to / precipitated by no known injury. The pain is located in the across the lower back and radiates to the left thigh, and sometimes to foot. The pain is described as aching, burning and sharp and occurs intermittently. She rates her pain as moderate. Symptoms are exacerbated by sitting, standing and walking. Symptoms are improved by heat, ice, narcotic pain medications and rest. She has also tried nothing which provided no symptom relief. She has no other symptoms associated with the back pain. The patient has no "red flag" history indicative of complicated back pain.  The following portions of the patient's history were reviewed and updated as appropriate: allergies, current medications, past family history, past medical history, past social history, past surgical history and problem list.  Review of Systems Pertinent items are noted in HPI.    Objective:   Inspection and palpation: inspection of back is normal. Muscle tone and ROM exam: muscle spasm noted low back. Straight leg raise: positive at 45 degrees on the left. Neurological: normal DTRs, muscle strength and reflexes.    Assessment:    Nonspecific acute low back pain    Plan:    Natural history and expected course discussed. Questions answered. Short (2-4 day) period of relative rest recommended until acute symptoms improve. Ice to affected area as needed for local pain relief. Heat to affected area as needed for local pain relief. Muscle relaxants per medication orders. Follow-up in 2 weeks. --check xrays

## 2013-12-22 ENCOUNTER — Other Ambulatory Visit: Payer: Self-pay | Admitting: Obstetrics and Gynecology

## 2013-12-22 DIAGNOSIS — R928 Other abnormal and inconclusive findings on diagnostic imaging of breast: Secondary | ICD-10-CM

## 2014-01-06 ENCOUNTER — Ambulatory Visit
Admission: RE | Admit: 2014-01-06 | Discharge: 2014-01-06 | Disposition: A | Discharge status: Home / Self Care | Payer: BC Managed Care – PPO | Source: Ambulatory Visit | Attending: Obstetrics and Gynecology | Admitting: Obstetrics and Gynecology

## 2014-01-06 DIAGNOSIS — R928 Other abnormal and inconclusive findings on diagnostic imaging of breast: Secondary | ICD-10-CM

## 2014-03-18 ENCOUNTER — Ambulatory Visit (INDEPENDENT_AMBULATORY_CARE_PROVIDER_SITE_OTHER): Payer: BC Managed Care – PPO | Admitting: Emergency Medicine

## 2014-03-18 VITALS — BP 128/74 | HR 107 | Temp 98.7°F | Resp 16 | Ht 67.0 in | Wt 194.0 lb

## 2014-03-18 DIAGNOSIS — F32A Depression, unspecified: Secondary | ICD-10-CM

## 2014-03-18 DIAGNOSIS — F341 Dysthymic disorder: Secondary | ICD-10-CM

## 2014-03-18 DIAGNOSIS — F419 Anxiety disorder, unspecified: Principal | ICD-10-CM

## 2014-03-18 DIAGNOSIS — F329 Major depressive disorder, single episode, unspecified: Secondary | ICD-10-CM

## 2014-03-18 MED ORDER — LORAZEPAM 1 MG PO TABS: 1.0000 mg | ORAL_TABLET | Freq: Three times a day (TID) | ORAL | Status: DC | PRN

## 2014-03-18 MED ORDER — PAROXETINE HCL 20 MG PO TABS: 20.0000 mg | ORAL_TABLET | Freq: Every day | ORAL | Status: DC

## 2014-03-18 NOTE — Progress Notes (Signed)
[facility] [address], [place] [address] [phone]  Date:  03/18/2014   Name:  Carollee SiresChasity I Duross   DOB:  12/11/1975   MRN:  [id number]  PCP:  No primary provider on file.    Chief Complaint: Headache, Fatigue, Generalized Body Aches and Gastrophageal Reflux   History of Present Illness:  Sheila Scott is a 38 y.o. very pleasant female patient who presents with the following:  1 year duration difficulty sleeping.  Has 2 children and a mother living with her that are dependent upon her.  She works [employer] and is usually in bed by 0300.  Can't fall asleep until 0500 and sleeps until noon.   Feels anxious and depressed.  Fells overwhelmed by responsibilities.  Troubled by work Counselling psychologistresponsibilities.  Not suicidal and has no thought of harm to others.  No improvement with over the counter medications or other home remedies. Denies other complaint or health concern today.   Patient Active Problem List   Diagnosis Date Noted  . OTH ABNORMAL PAPANICOLAOU SMEAR CERVIX&CERV HPV 02/16/2011  . HYPERLIPIDEMIA 02/14/2011  . TOBACCO USE 02/14/2011  . HYPERGLYCEMIA, FASTING 02/14/2011    History reviewed. No pertinent past medical history.  Past Surgical History  Procedure Laterality Date  . Tubal ligation    . Cyst removal hand      wrist , left     History  Substance Use Topics  . Smoking status: Current Every Day Smoker -- 0.50 packs/day    Types: Cigarettes  . Smokeless tobacco: Never Used  . Alcohol Use: Yes     Comment: occ    Family History  Problem Relation Age of Onset  . Cancer Mother   . Hypertension Mother   . Mental retardation Mother   . Heart disease Mother   . Diabetes Father   . Diabetes Maternal Grandfather     No Known Allergies  Medication list has been reviewed and updated.  Current Outpatient Prescriptions on File Prior to Visit  Medication Sig Dispense Refill  . cyclobenzaprine (FLEXERIL) 10 MG tablet Take 1 tablet (10  mg total) by mouth 3 (three) times daily as needed for muscle spasms.  30 tablet  0  . HYDROcodone-acetaminophen (NORCO/VICODIN) 5-325 MG per tablet Take 2 tablets by mouth every 4 (four) hours as needed for pain.  10 tablet  0  . ibuprofen (ADVIL,MOTRIN) 200 MG tablet Take 200 mg by mouth every 6 (six) hours as needed for pain.      Marland Kitchen. ibuprofen (ADVIL,MOTRIN) 800 MG tablet Take 1 tablet (800 mg total) by mouth 3 (three) times daily.  21 tablet  0   No current facility-administered medications on file prior to visit.    Review of Systems:  As per HPI, otherwise negative.    Physical Examination: Filed Vitals:   03/18/14 1541  BP: 128/74  Pulse: 107  Temp: 98.7 F (37.1 C)  Resp: 16   Filed Vitals:   03/18/14 1541  Height: 5\' 7"  (1.702 m)  Weight: 194 lb (87.998 kg)   Body mass index is 30.38 kg/(m^2). Ideal Body Weight: Weight in (lb) to have BMI = 25: 159.3   GEN: WDWN, NAD, Non-toxic, Alert & Oriented x 3 HEENT: Atraumatic, Normocephalic.  Ears and Nose: No external deformity. EXTR: No clubbing/cyanosis/edema NEURO: Normal gait.  PSYCH: Normally interactive. Conversant. Not depressed or anxious appearing.  Calm demeanor.    Assessment and Plan: Depression and anxiety Ativan for one month paxil  Signed,  Ellison Carwin, MD

## 2014-03-18 NOTE — Patient Instructions (Signed)
Insomnia Insomnia is frequent trouble falling and/or staying asleep. Insomnia can be a long term problem or a short term problem. Both are common. Insomnia can be a short term problem when the wakefulness is related to a certain stress or worry. Long term insomnia is often related to ongoing stress during waking hours and/or poor sleeping habits. Overtime, sleep deprivation itself can make the problem worse. Every little thing feels more severe because you are overtired and your ability to cope is decreased. CAUSES   Stress, anxiety, and depression.  Poor sleeping habits.  Distractions such as TV in the bedroom.  Naps close to bedtime.  Engaging in emotionally charged conversations before bed.  Technical reading before sleep.  Alcohol and other sedatives. They may make the problem worse. They can hurt normal sleep patterns and normal dream activity.  Stimulants such as caffeine for several hours prior to bedtime.  Pain syndromes and shortness of breath can cause insomnia.  Exercise late at night.  Changing time zones may cause sleeping problems (jet lag). It is sometimes helpful to have someone observe your sleeping patterns. They should look for periods of not breathing during the night (sleep apnea). They should also look to see how long those periods last. If you live alone or observers are uncertain, you can also be observed at a sleep clinic where your sleep patterns will be professionally monitored. Sleep apnea requires a checkup and treatment. Give your caregivers your medical history. Give your caregivers observations your family has made about your sleep.  SYMPTOMS   Not feeling rested in the morning.  Anxiety and restlessness at bedtime.  Difficulty falling and staying asleep. TREATMENT   Your caregiver may prescribe treatment for an underlying medical disorders. Your caregiver can give advice or help if you are using alcohol or other drugs for self-medication. Treatment  of underlying problems will usually eliminate insomnia problems.  Medications can be prescribed for short time use. They are generally not recommended for lengthy use.  Over-the-counter sleep medicines are not recommended for lengthy use. They can be habit forming.  You can promote easier sleeping by making lifestyle changes such as:  Using relaxation techniques that help with breathing and reduce muscle tension.  Exercising earlier in the day.  Changing your diet and the time of your last meal. No night time snacks.  Establish a regular time to go to bed.  Counseling can help with stressful problems and worry.  Soothing music and white noise may be helpful if there are background noises you cannot remove.  Stop tedious detailed work at least one hour before bedtime. HOME CARE INSTRUCTIONS   Keep a diary. Inform your caregiver about your progress. This includes any medication side effects. See your caregiver regularly. Take note of:  Times when you are asleep.  Times when you are awake during the night.  The quality of your sleep.  How you feel the next day. This information will help your caregiver care for you.  Get out of bed if you are still awake after 15 minutes. Read or do some quiet activity. Keep the lights down. Wait until you feel sleepy and go back to bed.  Keep regular sleeping and waking hours. Avoid naps.  Exercise regularly.  Avoid distractions at bedtime. Distractions include watching television or engaging in any intense or detailed activity like attempting to balance the household checkbook.  Develop a bedtime ritual. Keep a familiar routine of bathing, brushing your teeth, climbing into bed at the same   time each night, listening to soothing music. Routines increase the success of falling to sleep faster.  Use relaxation techniques. This can be using breathing and muscle tension release routines. It can also include visualizing peaceful scenes. You can  also help control troubling or intruding thoughts by keeping your mind occupied with boring or repetitive thoughts like the old concept of counting sheep. You can make it more creative like imagining planting one beautiful flower after another in your backyard garden.  During your day, work to eliminate stress. When this is not possible use some of the previous suggestions to help reduce the anxiety that accompanies stressful situations. MAKE SURE YOU:   Understand these instructions.  Will watch your condition.  Will get help right away if you are not doing well or get worse. Document Released: 11/16/2000 Document Revised: 02/11/2012 Document Reviewed: 12/17/2007 [facility] Patient Information 2014 [place], [place]. Generalized Anxiety Disorder Generalized anxiety disorder (GAD) is a mental disorder. It interferes with life functions, including relationships, work, and school. GAD is different from normal anxiety, which everyone experiences at some point in their lives in response to specific life events and activities. Normal anxiety actually helps us prepare for and get through these life events and activities. Normal anxiety goes away after the event or activity is over.  GAD causes anxiety that is not necessarily related to specific events or activities. It also causes excess anxiety in proportion to specific events or activities. The anxiety associated with GAD is also difficult to control. GAD can vary from mild to severe. People with severe GAD can have intense waves of anxiety with physical symptoms (panic attacks).  SYMPTOMS The anxiety and worry associated with GAD are difficult to control. This anxiety and worry are related to many life events and activities and also occur more days than not for 6 months or longer. People with GAD also have three or more of the following symptoms (one or more in children):  Restlessness.   Fatigue.  Difficulty concentrating.    Irritability.  Muscle tension.  Difficulty sleeping or unsatisfying sleep. DIAGNOSIS GAD is diagnosed through an assessment by your caregiver. Your caregiver will ask you questions aboutyour mood,physical symptoms, and events in your life. Your caregiver may ask you about your medical history and use of alcohol or drugs, including prescription medications. Your caregiver may also do a physical exam and blood tests. Certain medical conditions and the use of certain substances can cause symptoms similar to those associated with GAD. Your caregiver may refer you to a mental health specialist for further evaluation. TREATMENT The following therapies are usually used to treat GAD:   Medication Antidepressant medication usually is prescribed for long-term daily control. Antianxiety medications may be added in severe cases, especially when panic attacks occur.   Talk therapy (psychotherapy) Certain types of talk therapy can be helpful in treating GAD by providing support, education, and guidance. A form of talk therapy called cognitive behavioral therapy can teach you healthy ways to think about and react to daily life events and activities.  Stress managementtechniques These include yoga, meditation, and exercise and can be very helpful when they are practiced regularly. A mental health specialist can help determine which treatment is best for you. Some people see improvement with one therapy. However, other people require a combination of therapies. Document Released: 03/16/2013 Document Reviewed: 03/16/2013 [facility] Patient Information 2014 [place], [place].

## 2014-09-02 ENCOUNTER — Other Ambulatory Visit: Payer: Self-pay | Admitting: Obstetrics and Gynecology

## 2014-09-03 LAB — CYTOLOGY - PAP

## 2015-04-28 ENCOUNTER — Other Ambulatory Visit: Payer: Self-pay | Admitting: Obstetrics and Gynecology

## 2015-04-29 LAB — CYTOLOGY - PAP

## 2015-07-11 ENCOUNTER — Ambulatory Visit (INDEPENDENT_AMBULATORY_CARE_PROVIDER_SITE_OTHER): Payer: BLUE CROSS/BLUE SHIELD | Admitting: Emergency Medicine

## 2015-07-11 VITALS — BP 140/90 | HR 86 | Temp 98.6°F | Resp 16 | Ht 66.0 in | Wt 183.3 lb

## 2015-07-11 DIAGNOSIS — J0140 Acute pansinusitis, unspecified: Secondary | ICD-10-CM | POA: Diagnosis not present

## 2015-07-11 DIAGNOSIS — J209 Acute bronchitis, unspecified: Secondary | ICD-10-CM | POA: Diagnosis not present

## 2015-07-11 MED ORDER — PSEUDOEPHEDRINE-GUAIFENESIN ER 60-600 MG PO TB12: 1.0000 | ORAL_TABLET | Freq: Two times a day (BID) | ORAL | Status: AC

## 2015-07-11 MED ORDER — HYDROCOD POLST-CPM POLST ER 10-8 MG/5ML PO SUER: 5.0000 mL | Freq: Two times a day (BID) | ORAL | Status: DC

## 2015-07-11 MED ORDER — AMOXICILLIN-POT CLAVULANATE 875-125 MG PO TABS
1.0000 | ORAL_TABLET | Freq: Two times a day (BID) | ORAL | Status: DC
Start: 2015-07-11 — End: 2016-07-20

## 2015-07-11 NOTE — Patient Instructions (Signed)

## 2015-07-11 NOTE — Progress Notes (Signed)
Subjective:  Patient ID: Sheila Scott, female    DOB: 10/12/1976  Age: 39 y.o. MRN: [id number]  CC: Nasal Congestion and Cough   HPI Sheila Scott presents  with illness for the last week with nasal congestion and significant for purulent nasal drainage she has some postnasal drainage and well. She now has a cough over the last several days with scant purulent sputum as well no wheezing or shortness breath. No fever chills. No nausea or vomiting. She had no improvement with over-the-counter medication.  History Sheila Scott has no past medical history on file.   She has past surgical history that includes Tubal ligation and Cyst removal hand.   Her  family history includes Cancer in her mother; Diabetes in her father and maternal grandfather; Heart disease in her mother; Hypertension in her mother; Mental retardation in her mother.  She   reports that she has been smoking Cigarettes.  She has been smoking about 0.50 packs per day. She has never used smokeless tobacco. She reports that she drinks alcohol. She reports that she does not use illicit drugs.  Outpatient Prescriptions Prior to Visit  Medication Sig Dispense Refill  . cyclobenzaprine (FLEXERIL) 10 MG tablet Take 1 tablet (10 mg total) by mouth 3 (three) times daily as needed for muscle spasms. (Patient not taking: Reported on 07/11/2015) 30 tablet 0  . HYDROcodone-acetaminophen (NORCO/VICODIN) 5-325 MG per tablet Take 2 tablets by mouth every 4 (four) hours as needed for pain. (Patient not taking: Reported on 07/11/2015) 10 tablet 0  . ibuprofen (ADVIL,MOTRIN) 200 MG tablet Take 200 mg by mouth every 6 (six) hours as needed for pain.    Marland Kitchen ibuprofen (ADVIL,MOTRIN) 800 MG tablet Take 1 tablet (800 mg total) by mouth 3 (three) times daily. (Patient not taking: Reported on 07/11/2015) 21 tablet 0  . LORazepam (ATIVAN) 1 MG tablet Take 1 tablet (1 mg total) by mouth 3 (three) times daily as needed for anxiety. (Patient not taking:  Reported on 07/11/2015) 90 tablet 0  . PARoxetine (PAXIL) 20 MG tablet Take 1 tablet (20 mg total) by mouth daily. (Patient not taking: Reported on 07/11/2015) 30 tablet 5   No facility-administered medications prior to visit.    History   Social History  . Marital Status: Single    Spouse Name: N/A  . Number of Children: N/A  . Years of Education: N/A   Social History Main Topics  . Smoking status: Current Every Day Smoker -- 0.50 packs/day    Types: Cigarettes  . Smokeless tobacco: Never Used  . Alcohol Use: Yes     Comment: occ  . Drug Use: No  . Sexual Activity: Yes   Other Topics Concern  . None   Social History Narrative     Review of Systems  Constitutional: Negative for fever, chills and appetite change.  HENT: Negative for congestion, ear pain, postnasal drip, sinus pressure and sore throat.   Eyes: Negative for pain and redness.  Respiratory: Negative for cough, shortness of breath and wheezing.   Cardiovascular: Negative for leg swelling.  Gastrointestinal: Negative for nausea, vomiting, abdominal pain, diarrhea, constipation and blood in stool.  Endocrine: Negative for polyuria.  Genitourinary: Negative for dysuria, urgency, frequency and flank pain.  Musculoskeletal: Negative for gait problem.  Skin: Negative for rash.  Neurological: Negative for weakness and headaches.  Psychiatric/Behavioral: Negative for confusion and decreased concentration. The patient is not nervous/anxious.     Objective:  BP 140/90 mmHg  Pulse 86  Temp(Src) 98.6 F (37 C) (Oral)  Resp 16  Ht  (1.676 m)  Wt 183 lb 4.8 oz (83.144 kg)  BMI 29.60 kg/m2  SpO2 98%  LMP 07/06/2015  Physical Exam  Constitutional: She is oriented to person, place, and time. She appears well-developed and well-nourished. No distress.  HENT:  Head: Normocephalic and atraumatic.  Right Ear: External ear normal.  Left Ear: External ear normal.  Nose: Nose normal.  Eyes: Conjunctivae and EOM are  normal. Pupils are equal, round, and reactive to light. No scleral icterus.  Neck: Normal range of motion. Neck supple. No tracheal deviation present.  Cardiovascular: Normal rate, regular rhythm and normal heart sounds.   Pulmonary/Chest: Effort normal. No respiratory distress. She has no wheezes. She has no rales.  Abdominal: She exhibits no mass. There is no tenderness. There is no rebound and no guarding.  Musculoskeletal: She exhibits no edema.  Lymphadenopathy:    She has no cervical adenopathy.  Neurological: She is alert and oriented to person, place, and time. Coordination normal.  Skin: Skin is warm and dry. No rash noted.  Psychiatric: She has a normal mood and affect. Her behavior is normal.      Assessment & Plan:   Sheila Scott was seen today for nasal congestion and cough.  Diagnoses and all orders for this visit:  Acute bronchitis, unspecified organism  Acute pansinusitis, recurrence not specified  Other orders -     amoxicillin-clavulanate (AUGMENTIN) 875-125 MG per tablet; Take 1 tablet by mouth 2 (two) times daily. -     pseudoephedrine-guaifenesin (MUCINEX D) 60-600 MG per tablet; Take 1 tablet by mouth every 12 (twelve) hours. -     chlorpheniramine-HYDROcodone (TUSSIONEX PENNKINETIC ER) 10-8 MG/5ML SUER; Take 5 mLs by mouth 2 (two) times daily.  I am having Sheila Scott start on amoxicillin-clavulanate, pseudoephedrine-guaifenesin, and chlorpheniramine-HYDROcodone. I am also having her maintain her ibuprofen, HYDROcodone-acetaminophen, cyclobenzaprine, ibuprofen, PARoxetine, and LORazepam.  Meds ordered this encounter  Medications  . amoxicillin-clavulanate (AUGMENTIN) 875-125 MG per tablet    Sig: Take 1 tablet by mouth 2 (two) times daily.    Dispense:  20 tablet    Refill:  0  . pseudoephedrine-guaifenesin (MUCINEX D) 60-600 MG per tablet    Sig: Take 1 tablet by mouth every 12 (twelve) hours.    Dispense:  18 tablet    Refill:  0  .  chlorpheniramine-HYDROcodone (TUSSIONEX PENNKINETIC ER) 10-8 MG/5ML SUER    Sig: Take 5 mLs by mouth 2 (two) times daily.    Dispense:  60 mL    Refill:  0    Appropriate red flag conditions were discussed with the patient as well as actions that should be taken.  Patient expressed his understanding.  Follow-up: Return if symptoms worsen or fail to improve.  Carmelina Dane, MD

## 2016-07-20 ENCOUNTER — Encounter: Payer: Self-pay | Admitting: Urgent Care

## 2016-07-20 ENCOUNTER — Ambulatory Visit (INDEPENDENT_AMBULATORY_CARE_PROVIDER_SITE_OTHER): Payer: BLUE CROSS/BLUE SHIELD | Admitting: Urgent Care

## 2016-07-20 VITALS — BP 124/90 | HR 92 | Temp 98.3°F | Resp 18 | Ht 65.0 in | Wt 189.6 lb

## 2016-07-20 DIAGNOSIS — M25511 Pain in right shoulder: Secondary | ICD-10-CM | POA: Diagnosis not present

## 2016-07-20 MED ORDER — NAPROXEN SODIUM 550 MG PO TABS: 550.0000 mg | ORAL_TABLET | Freq: Two times a day (BID) | ORAL | 1 refills | Status: DC

## 2016-07-20 NOTE — Progress Notes (Signed)
    MRN: [id number] DOB: 03/07/1976  Subjective:   Sheila Scott is a 40 y.o. female presenting for chief complaint of Shoulder Pain (Right)  Reports 1 day history of right shoulder pain. Pain is sharp, aggravated by overhead activity. She cannot identify any inciting event, pain simply started while she was driving. 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 Has tried resting, ibuprofen with minimal relief. Denies fever, neck pain, swelling, bruising, trauma, heavy lifting. Patient's primary concern is that she has a dislocated shoulder.  Jenascia currently has no medications in their medication list. Also has No Known Allergies.  Geraldyn  has no past medical history on file. Also  has a past surgical history that includes Tubal ligation and Cyst removal hand.  Objective:   Vitals: BP 124/90   Pulse 92   Temp 98.3 F (36.8 C) (Oral)   Resp 18   Ht 5\' 5"  (1.651 m)   Wt 189 lb 9.6 oz (86 kg)   LMP 07/16/2016   SpO2 98%   BMI 31.55 kg/m   Physical Exam  Constitutional: She is oriented to person, place, and time. She appears well-developed and well-nourished.  Cardiovascular: Normal rate.   Pulmonary/Chest: Effort normal.  Musculoskeletal:       Right shoulder: She exhibits decreased range of motion (external rotation), tenderness (positive Hawkin test) and decreased strength (secondary to pain per patient). She exhibits no bony tenderness, no swelling, no effusion, no crepitus, no deformity and no spasm.       Cervical back: She exhibits spasm (right trapezius). She exhibits normal range of motion, no tenderness, no bony tenderness, no swelling, no edema and no deformity.  Neurological: She is alert and oriented to person, place, and time. She has normal reflexes.  Skin: Skin is warm and dry.   Assessment and Plan :   1. Right shoulder pain - Start Anaprox for right shoulder pain which I suspect is shoulder impingement. Counseled on not  immobilizing shoulder to avoid adhesive capsulitis. Patient will perform rehab exercises at home, use Anaprox as needed for pain and inflammation. Consider PT or referral to ortho if no improvement in 1-2 weeks.  Wallis BambergMario Braidyn Scorsone, PA-C [facility] [phone] 07/20/2016 12:20 PM

## 2016-07-20 NOTE — Patient Instructions (Addendum)
Shoulder Pain The shoulder is the joint that connects your arms to your body. The bones that form the shoulder joint include the upper arm bone (humerus), the shoulder blade (scapula), and the collarbone (clavicle). The top of the humerus is shaped like a ball and fits into a rather flat socket on the scapula (glenoid cavity). A combination of muscles and strong, fibrous tissues that connect muscles to bones (tendons) support your shoulder joint and hold the ball in the socket. Small, fluid-filled sacs (bursae) are located in different areas of the joint. They act as cushions between the bones and the overlying soft tissues and help reduce friction between the gliding tendons and the bone as you move your arm. Your shoulder joint allows a wide range of motion in your arm. This range of motion allows you to do things like scratch your back or throw a ball. However, this range of motion also makes your shoulder more prone to pain from overuse and injury. Causes of shoulder pain can originate from both injury and overuse and usually can be grouped in the following four categories:  Redness, swelling, and pain (inflammation) of the tendon (tendinitis) or the bursae (bursitis).  Instability, such as a dislocation of the joint.  Inflammation of the joint (arthritis).  Broken bone (fracture). HOME CARE INSTRUCTIONS   Apply ice to the sore area.  Put ice in a plastic bag.  Place a towel between your skin and the bag.  Leave the ice on for 15-20 minutes, 3-4 times per day for the first 2 days, or as directed by your health care provider.  Stop using cold packs if they do not help with the pain.  If you have a shoulder sling or immobilizer, wear it as long as your caregiver instructs. Only remove it to shower or bathe. Move your arm as little as possible, but keep your hand moving to prevent swelling.  Squeeze a soft ball or foam pad as much as possible to help prevent swelling.  Only take  over-the-counter or prescription medicines for pain, discomfort, or fever as directed by your caregiver. SEEK MEDICAL CARE IF:   Your shoulder pain increases, or new pain develops in your arm, hand, or fingers.  Your hand or fingers become cold and numb.  Your pain is not relieved with medicines. SEEK IMMEDIATE MEDICAL CARE IF:   Your arm, hand, or fingers are numb or tingling.  Your arm, hand, or fingers are significantly swollen or turn white or blue. MAKE SURE YOU:   Understand these instructions.  Will watch your condition.  Will get help right away if you are not doing well or get worse.   This information is not intended to replace advice given to you by your health care provider. Make sure you discuss any questions you have with your health care provider.   Document Released: 08/29/2005 Document Revised: 12/10/2014 Document Reviewed: 03/14/2015 Elsevier Interactive Patient Education [employer]2016 Elsevier Inc.     IF you received an x-ray today, you will receive an invoice from [facility]. Please contact [facility] at [phone] with questions or concerns regarding your invoice.   IF you received labwork today, you will receive an invoice from [facility]. Please contact [facility] at [phone] with questions or concerns regarding your invoice.   Our billing staff will not be able to assist you with questions regarding bills from these companies.  You will be contacted with the lab results as soon as they are available. The  fastest way to get your results is to activate your My Chart account. Instructions are located on the last page of this paperwork. If you have not heard from [place] regarding the results in 2 weeks, please contact this office.

## 2017-02-06 ENCOUNTER — Ambulatory Visit (INDEPENDENT_AMBULATORY_CARE_PROVIDER_SITE_OTHER): Payer: BLUE CROSS/BLUE SHIELD | Admitting: Urgent Care

## 2017-02-06 VITALS — BP 148/108 | HR 96 | Temp 98.8°F | Resp 17 | Ht 64.5 in | Wt 188.0 lb

## 2017-02-06 DIAGNOSIS — R197 Diarrhea, unspecified: Secondary | ICD-10-CM

## 2017-02-06 DIAGNOSIS — A084 Viral intestinal infection, unspecified: Secondary | ICD-10-CM | POA: Diagnosis not present

## 2017-02-06 DIAGNOSIS — R8290 Unspecified abnormal findings in urine: Secondary | ICD-10-CM

## 2017-02-06 DIAGNOSIS — R112 Nausea with vomiting, unspecified: Secondary | ICD-10-CM | POA: Diagnosis not present

## 2017-02-06 LAB — POCT URINALYSIS DIP (MANUAL ENTRY)
BILIRUBIN UA: NEGATIVE
BILIRUBIN UA: NEGATIVE
Glucose, UA: NEGATIVE
Nitrite, UA: NEGATIVE
PROTEIN UA: NEGATIVE
Urobilinogen, UA: 0.2
pH, UA: 6

## 2017-02-06 LAB — POC MICROSCOPIC URINALYSIS (UMFC): Mucus: ABSENT

## 2017-02-06 LAB — POCT CBC
GRANULOCYTE PERCENT: 52.1 % (ref 37–80)
HCT, POC: 39.5 % (ref 37.7–47.9)
Hemoglobin: 13.7 g/dL (ref 12.2–16.2)
Lymph, poc: 2 (ref 0.6–3.4)
MCH, POC: 30.8 pg (ref 27–31.2)
MCHC: 34.6 g/dL (ref 31.8–35.4)
MCV: 88.9 fL (ref 80–97)
MID (CBC): 0.5 (ref 0–0.9)
MPV: 7.4 fL (ref 0–99.8)
PLATELET COUNT, POC: 251 10*3/uL (ref 142–424)
POC Granulocyte: 2.7 (ref 2–6.9)
POC LYMPH PERCENT: 39.2 %L (ref 10–50)
POC MID %: 8.7 %M (ref 0–12)
RBC: 4.45 M/uL (ref 4.04–5.48)
RDW, POC: 13.5 %
WBC: 5.2 10*3/uL (ref 4.6–10.2)

## 2017-02-06 MED ORDER — ONDANSETRON 4 MG PO TBDP
8.0000 mg | ORAL_TABLET | Freq: Once | ORAL | Status: AC
Start: 2017-02-06 — End: 2017-02-06
  Administered 2017-02-06: 8 mg via ORAL

## 2017-02-06 MED ORDER — ONDANSETRON 8 MG PO TBDP: 8.0000 mg | ORAL_TABLET | Freq: Three times a day (TID) | ORAL | 0 refills | Status: DC | PRN

## 2017-02-06 NOTE — Patient Instructions (Addendum)
Food Choices to Help Relieve Diarrhea, Adult When you have diarrhea, the foods you eat and your eating habits are very important. Choosing the right foods and drinks can help:  Relieve diarrhea.  Replace lost fluids and nutrients.  Prevent dehydration. What general guidelines should I follow? Relieving diarrhea   Choose foods with less than 2 g or .07 oz. of fiber per serving.  Limit fats to less than 8 tsp (38 g or 1.34 oz.) a day.  Avoid the following:  Foods and beverages sweetened with high-fructose corn syrup, honey, or sugar alcohols such as xylitol, sorbitol, and mannitol.  Foods that contain a lot of fat or sugar.  Fried, greasy, or spicy foods.  High-fiber grains, breads, and cereals.  Raw fruits and vegetables.  Eat foods that are rich in probiotics. These foods include dairy products such as yogurt and fermented milk products. They help increase healthy bacteria in the stomach and intestines (gastrointestinal tract, or GI tract).  If you have lactose intolerance, avoid dairy products. These may make your diarrhea worse.  Take medicine to help stop diarrhea (antidiarrheal medicine) only as told by your health care provider. Replacing nutrients   Eat small meals or snacks every 3-4 hours.  Eat bland foods, such as white rice, toast, or baked potato, until your diarrhea starts to get better. Gradually reintroduce nutrient-rich foods as tolerated or as told by your health care provider. This includes:  Well-cooked protein foods.  Peeled, seeded, and soft-cooked fruits and vegetables.  Low-fat dairy products.  Take vitamin and mineral supplements as told by your health care provider. Preventing dehydration    Start by sipping water or a special solution to prevent dehydration (oral rehydration solution, ORS). Urine that is clear or pale yellow means that you are getting enough fluid.  Try to drink at least 8-10 cups of fluid each day to help replace lost  fluids.  You may add other liquids in addition to water, such as clear juice or decaffeinated sports drinks, as tolerated or as told by your health care provider.  Avoid drinks with caffeine, such as coffee, tea, or soft drinks.  Avoid alcohol. What foods are recommended? The items listed may not be a complete list. Talk with your health care provider about what dietary choices are best for you. Grains  White rice. White, [place], or pita breads (fresh or toasted), including plain rolls, buns, or bagels. White pasta. Saltine, soda, or graham crackers. Pretzels. Low-fiber cereal. Cooked cereals made with water (such as cornmeal, farina, or cream cereals). Plain muffins. Matzo. Melba toast. Zwieback. Vegetables  Potatoes (without the skin). Most well-cooked and canned vegetables without skins or seeds. Tender lettuce. Fruits  Apple sauce. Fruits canned in juice. Cooked apricots, cherries, grapefruit, peaches, pears, or plums. Fresh bananas and cantaloupe. Meats and other protein foods  Baked or boiled chicken. Eggs. Tofu. Fish. Seafood. Smooth nut butters. Ground or well-cooked tender beef, ham, veal, lamb, pork, or poultry. Dairy  Plain yogurt, kefir, and unsweetened liquid yogurt. Lactose-free milk, buttermilk, skim milk, or soy milk. Low-fat or nonfat hard cheese. Beverages  Water. Low-calorie sports drinks. Fruit juices without pulp. Strained tomato and vegetable juices. Decaffeinated teas. Sugar-free beverages not sweetened with sugar alcohols. Oral rehydration solutions, if approved by your health care provider. Seasoning and other foods  Bouillon, broth, or soups made from recommended foods. What foods are not recommended? The items listed may not be a complete list. Talk with your health care provider about what dietary choices  bran, or rye breads, rolls, pastas, and crackers. Wild or brown rice. Whole grain or bran cereals. Barley. Oats and  oatmeal. Corn tortillas or taco shells. Granola. Popcorn. Vegetables Raw vegetables. Fried vegetables. Cabbage, broccoli, Brussels sprouts, artichokes, baked beans, beet greens, corn, kale, legumes, peas, sweet potatoes, and yams. Potato skins. Cooked spinach and cabbage. Fruits Dried fruit, including raisins and dates. Raw fruits. Stewed or dried prunes. Canned fruits with syrup. Meat and other protein foods Fried or fatty meats. Deli meats. Chunky nut butters. Nuts and seeds. Beans and lentils. Bacon. Hot dogs. Sausage. Dairy High-fat cheeses. Whole milk, chocolate milk, and beverages made with milk, such as milk shakes. Half-and-half. Cream. sour cream. Ice cream. Beverages Caffeinated beverages (such as coffee, tea, soda, or energy drinks). Alcoholic beverages. Fruit juices with pulp. Prune juice. Soft drinks sweetened with high-fructose corn syrup or sugar alcohols. High-calorie sports drinks. Fats and oils Butter. Cream sauces. Margarine. Salad oils. Plain salad dressings. Olives. Avocados. Mayonnaise. Sweets and desserts Sweet rolls, doughnuts, and sweet breads. Sugar-free desserts sweetened with sugar alcohols such as xylitol and sorbitol. Seasoning and other foods Honey. Hot sauce. Chili powder. Gravy. Cream-based or milk-based soups. Pancakes and waffles. Summary  When you have diarrhea, the foods you eat and your eating habits are very important.  Make sure you get at least 8-10 cups of fluid each day, or enough to keep your urine clear or pale yellow.  Eat bland foods and gradually reintroduce healthy, nutrient-rich foods as tolerated, or as told by your health care provider.  Avoid high-fiber, fried, greasy, or spicy foods. This information is not intended to replace advice given to you by your health care provider. Make sure you discuss any questions you have with your health care provider. Document Released: 02/09/2004 Document Revised: 11/16/2016 Document Reviewed:  11/16/2016 Elsevier Interactive Patient Education  2017 Elsevier Inc.     IF you received an x-ray today, you will receive an invoice from [facility]. Please contact [facility] at [phone] with questions or concerns regarding your invoice.   IF you received labwork today, you will receive an invoice from [facility]. Please contact [facility] at [phone] with questions or concerns regarding your invoice.   Our billing staff will not be able to assist you with questions regarding bills from these companies.  You will be contacted with the lab results as soon as they are available. The fastest way to get your results is to activate your My Chart account. Instructions are located on the last page of this paperwork. If you have not heard from us regarding the results in 2 weeks, please contact this office.     

## 2017-02-06 NOTE — Progress Notes (Signed)
MRN: [id number] DOB: 01/08/1976  Subjective:   Sheila Scott is a 41 y.o. female presenting for chief complaint of Nausea; Vomiting; and Diarrhea  Reports 2 day history of abdominal pain, upset stomach, nausea with vomiting (4-5 episodes), diarrhea (having 3 loose, watery bowel movements per day). Has tried to hydrate well, eating light meals. Has had 2 sick contacts with similar symptoms. Denies fever, heart racing, bloody stools, hematemesis, dysuria, hematuria. Patient is not currently on her cycle.  Sheila Scott is not currently taking any medications. Also has No Known Allergies.  Sheila Scott denies past medical history. Also  has a past surgical history that includes Tubal ligation and Cyst removal hand.  Objective:   Vitals: BP (!) 148/108 (BP Location: Right Arm, Patient Position: Sitting, Cuff Size: Normal)   Pulse 96   Temp 98.8 F (37.1 C) (Oral)   Resp 17   Ht 5' 4.5" (1.638 m)   Wt 188 lb (85.3 kg)   LMP 01/29/2017 (Approximate)   SpO2 97%   BMI 31.77 kg/m   BP Readings from Last 3 Encounters:  02/06/17 (!) 148/108  07/20/16 124/90  07/11/15 140/90    Physical Exam  Constitutional: She is oriented to person, place, and time. She appears well-developed and well-nourished.  Cardiovascular: Normal rate, regular rhythm and intact distal pulses.  Exam reveals no gallop and no friction rub.   No murmur heard. Pulmonary/Chest: No respiratory distress. She has no wheezes. She has no rales.  Abdominal: Soft. Bowel sounds are normal. She exhibits no distension and no mass. There is tenderness (mild over mid-abdomen). There is no guarding.  No CVA tenderness.  Neurological: She is alert and oriented to person, place, and time.  Skin: Skin is warm and dry.   Results for orders placed or performed in visit on 02/06/17 (from the past 24 hour(s))  POCT CBC     Status: None   Collection Time: 02/06/17  2:09 PM  Result Value Ref Range   WBC 5.2 4.6 - 10.2 K/uL   Lymph, poc 2.0  0.6 - 3.4   POC LYMPH PERCENT 39.2 10 - 50 %L   MID (cbc) 0.5 0 - 0.9   POC MID % 8.7 0 - 12 %M   POC Granulocyte 2.7 2 - 6.9   Granulocyte percent 52.1 37 - 80 %G   RBC 4.45 4.04 - 5.48 M/uL   Hemoglobin 13.7 12.2 - 16.2 g/dL   HCT, POC 40.9 81.1 - 47.9 %   MCV 88.9 80 - 97 fL   MCH, POC 30.8 27 - 31.2 pg   MCHC 34.6 31.8 - 35.4 g/dL   RDW, POC 91.4 %   Platelet Count, POC 251 142 - 424 K/uL   MPV 7.4 0 - 99.8 fL  POCT urinalysis dipstick     Status: Abnormal   Collection Time: 02/06/17  2:13 PM  Result Value Ref Range   Color, UA yellow yellow   Clarity, UA clear clear   Glucose, UA negative negative   Bilirubin, UA negative negative   Ketones, POC UA negative negative   Spec Grav, UA <=1.005    Blood, UA small (A) negative   pH, UA 6.0    Protein Ur, POC negative negative   Urobilinogen, UA 0.2    Nitrite, UA Negative Negative   Leukocytes, UA small (1+) (A) Negative  POCT Microscopic Urinalysis ([facility])     Status: Abnormal   Collection Time: 02/06/17  2:26 PM  Result Value Ref Range  WBC,UR,HPF,POC Few (A) None WBC/hpf   RBC,UR,HPF,POC None None RBC/hpf   Bacteria Many (A) None, Too numerous to count   Mucus Absent Absent   Epithelial Cells, UR Per Microscopy Few (A) None, Too numerous to count cells/hpf   Assessment and Plan :   1. Viral gastroenteritis 2. Nausea and vomiting, intractability of vomiting not specified, unspecified vomiting type 3. Diarrhea, unspecified type - Will manage conservatively for now as a viral gastroenteritis. Labs pending.  4. Abnormal urinalysis - Urine culture pending  Wallis BambergMario Shivaun Bilello, PA-C [facility] [phone] 02/06/2017  2:00 PM

## 2017-02-10 LAB — URINE CULTURE

## 2017-02-11 ENCOUNTER — Other Ambulatory Visit: Payer: Self-pay | Admitting: Urgent Care

## 2017-02-11 MED ORDER — CIPROFLOXACIN HCL 500 MG PO TABS: 500.0000 mg | ORAL_TABLET | Freq: Two times a day (BID) | ORAL | 0 refills | Status: DC

## 2017-12-07 DIAGNOSIS — H5213 Myopia, bilateral: Secondary | ICD-10-CM | POA: Diagnosis not present

## 2017-12-18 ENCOUNTER — Other Ambulatory Visit: Payer: Self-pay | Admitting: Radiology

## 2017-12-18 DIAGNOSIS — N6320 Unspecified lump in the left breast, unspecified quadrant: Secondary | ICD-10-CM

## 2017-12-20 ENCOUNTER — Ambulatory Visit
Admission: RE | Admit: 2017-12-20 | Discharge: 2017-12-20 | Disposition: A | Discharge status: Home / Self Care | Payer: BLUE CROSS/BLUE SHIELD | Source: Ambulatory Visit | Attending: Radiology | Admitting: Radiology

## 2017-12-20 DIAGNOSIS — R922 Inconclusive mammogram: Secondary | ICD-10-CM | POA: Diagnosis not present

## 2017-12-20 DIAGNOSIS — N6320 Unspecified lump in the left breast, unspecified quadrant: Secondary | ICD-10-CM

## 2017-12-20 DIAGNOSIS — N6012 Diffuse cystic mastopathy of left breast: Secondary | ICD-10-CM | POA: Diagnosis not present

## 2018-01-12 ENCOUNTER — Emergency Department (HOSPITAL_COMMUNITY): Payer: BLUE CROSS/BLUE SHIELD

## 2018-01-12 ENCOUNTER — Other Ambulatory Visit: Payer: Self-pay

## 2018-01-12 ENCOUNTER — Encounter (HOSPITAL_COMMUNITY): Payer: Self-pay

## 2018-01-12 DIAGNOSIS — Z5321 Procedure and treatment not carried out due to patient leaving prior to being seen by health care provider: Secondary | ICD-10-CM | POA: Diagnosis not present

## 2018-01-12 DIAGNOSIS — R0789 Other chest pain: Secondary | ICD-10-CM | POA: Diagnosis present

## 2018-01-12 DIAGNOSIS — R079 Chest pain, unspecified: Secondary | ICD-10-CM | POA: Diagnosis not present

## 2018-01-12 LAB — I-STAT BETA HCG BLOOD, ED (MC, WL, AP ONLY)

## 2018-01-12 LAB — I-STAT TROPONIN, ED: Troponin i, poc: 0.02 ng/mL (ref 0.00–0.08)

## 2018-01-12 NOTE — ED Triage Notes (Signed)
Pt reports that she was skating tonight. She states that she fell a couple of times and thinks she may have overexerted herself. She is complaining of centralized chest pressure and pain. She states that it is constant and she has never experience something like this before. A&Ox4.

## 2018-01-13 ENCOUNTER — Emergency Department (HOSPITAL_COMMUNITY)
Admission: EM | Admit: 2018-01-13 | Discharge: 2018-01-13 | Discharge status: Against Medical Advice | Payer: BLUE CROSS/BLUE SHIELD | Attending: Emergency Medicine | Admitting: Emergency Medicine

## 2018-01-13 LAB — CBC
HEMATOCRIT: 40 % (ref 36.0–46.0)
HEMOGLOBIN: 13.3 g/dL (ref 12.0–15.0)
MCH: 30 pg (ref 26.0–34.0)
MCHC: 33.3 g/dL (ref 30.0–36.0)
MCV: 90.1 fL (ref 78.0–100.0)
Platelets: 278 10*3/uL (ref 150–400)
RBC: 4.44 MIL/uL (ref 3.87–5.11)
RDW: 12.9 % (ref 11.5–15.5)
WBC: 9.9 10*3/uL (ref 4.0–10.5)

## 2018-01-13 LAB — BASIC METABOLIC PANEL
Anion gap: 10 (ref 5–15)
BUN: 7 mg/dL (ref 6–20)
CHLORIDE: 103 mmol/L (ref 101–111)
CO2: 22 mmol/L (ref 22–32)
Calcium: 8.7 mg/dL — ABNORMAL LOW (ref 8.9–10.3)
Creatinine, Ser: 0.78 mg/dL (ref 0.44–1.00)
GFR calc Af Amer: >60 mL/min (ref >60)
GFR calc non Af Amer: >60 mL/min (ref >60)
Glucose, Bld: 96 mg/dL (ref 65–99)
POTASSIUM: 3.1 mmol/L — AB (ref 3.5–5.1)
SODIUM: 135 mmol/L (ref 135–145)

## 2018-01-13 NOTE — ED Notes (Signed)
01/13/2018 , Follow-up call completed 

## 2018-01-13 NOTE — ED Notes (Signed)
No response in lobby to take to triage room.

## 2018-01-13 NOTE — ED Notes (Signed)
No answer when called to reassess vitals. 

## 2018-01-17 ENCOUNTER — Emergency Department (HOSPITAL_COMMUNITY)
Admission: EM | Admit: 2018-01-17 | Discharge: 2018-01-18 | Disposition: A | Discharge status: Home / Self Care | Payer: BLUE CROSS/BLUE SHIELD | Attending: Emergency Medicine | Admitting: Emergency Medicine

## 2018-01-17 ENCOUNTER — Other Ambulatory Visit: Payer: Self-pay

## 2018-01-17 ENCOUNTER — Encounter (HOSPITAL_COMMUNITY): Payer: Self-pay

## 2018-01-17 ENCOUNTER — Emergency Department (HOSPITAL_COMMUNITY): Payer: BLUE CROSS/BLUE SHIELD

## 2018-01-17 DIAGNOSIS — F17210 Nicotine dependence, cigarettes, uncomplicated: Secondary | ICD-10-CM | POA: Insufficient documentation

## 2018-01-17 DIAGNOSIS — R079 Chest pain, unspecified: Secondary | ICD-10-CM | POA: Diagnosis not present

## 2018-01-17 DIAGNOSIS — Z79899 Other long term (current) drug therapy: Secondary | ICD-10-CM | POA: Diagnosis not present

## 2018-01-17 DIAGNOSIS — R61 Generalized hyperhidrosis: Secondary | ICD-10-CM | POA: Diagnosis not present

## 2018-01-17 DIAGNOSIS — R0602 Shortness of breath: Secondary | ICD-10-CM | POA: Diagnosis not present

## 2018-01-17 DIAGNOSIS — R0789 Other chest pain: Secondary | ICD-10-CM | POA: Diagnosis not present

## 2018-01-17 LAB — BASIC METABOLIC PANEL
Anion gap: 11 (ref 5–15)
BUN: 6 mg/dL (ref 6–20)
CO2: 23 mmol/L (ref 22–32)
Calcium: 9.3 mg/dL (ref 8.9–10.3)
Chloride: 102 mmol/L (ref 101–111)
Creatinine, Ser: 0.77 mg/dL (ref 0.44–1.00)
Glucose, Bld: 98 mg/dL (ref 65–99)
POTASSIUM: 4.2 mmol/L (ref 3.5–5.1)
SODIUM: 136 mmol/L (ref 135–145)

## 2018-01-17 LAB — I-STAT BETA HCG BLOOD, ED (MC, WL, AP ONLY)

## 2018-01-17 LAB — CBC
HEMATOCRIT: 41.6 % (ref 36.0–46.0)
Hemoglobin: 13.7 g/dL (ref 12.0–15.0)
MCH: 30.1 pg (ref 26.0–34.0)
MCHC: 32.9 g/dL (ref 30.0–36.0)
MCV: 91.4 fL (ref 78.0–100.0)
Platelets: 245 10*3/uL (ref 150–400)
RBC: 4.55 MIL/uL (ref 3.87–5.11)
RDW: 13.5 % (ref 11.5–15.5)
WBC: 8.8 10*3/uL (ref 4.0–10.5)

## 2018-01-17 LAB — I-STAT TROPONIN, ED
Troponin i, poc: 0 ng/mL (ref 0.00–0.08)
Troponin i, poc: 0.05 ng/mL (ref 0.00–0.08)
Troponin i, poc: 0.06 ng/mL (ref 0.00–0.08)

## 2018-01-17 NOTE — ED Provider Notes (Signed)
[facility] EMERGENCY DEPARTMENT Provider Note   CSN: [id number] Arrival date & time: 01/17/18  1700     History   Chief Complaint Chief Complaint  Patient presents with  . Chest Pain    HPI Sheila Scott is a 42 y.o. female.  HPI   Episode last Sunday, again Wednesday night that resolved, then today had more episodes and came for evaluation, 4 episodes.  Central chest pressure in middle, no radiation.  Denies shortness of breath, reports that will breath heavy because of the pain. Pain is 10/10, never had anything like it before.  No nausea. Does report some diaphoresis.  No leg pain or swelling.  No fever, has been coughing some. Episodes last about a minute then improve, come on spontaneously. Not exertional. No chest pain now.    Hx of hyperlipidemia, has improved with dietary changes Mom had MI at 53yo Smoke cigarettes No recent surgeries, estrogens, immobilization   History reviewed. No pertinent past medical history.  Patient Active Problem List   Diagnosis Date Noted  . OTH ABNORMAL PAPANICOLAOU SMEAR CERVIX&CERV HPV 02/16/2011  . HYPERLIPIDEMIA 02/14/2011  . TOBACCO USE 02/14/2011  . HYPERGLYCEMIA, FASTING 02/14/2011    Past Surgical History:  Procedure Laterality Date  . CYST REMOVAL HAND     wrist , left   . TUBAL LIGATION      OB History    No data available       Home Medications    Prior to Admission medications   Medication Sig Start Date End Date Taking? Authorizing Provider  Cholecalciferol (VITAMIN D3) 5000 units CAPS Take 5,000 Units by mouth daily.   Yes [provider]  Phenylephrine-APAP-Guaifenesin (MUCINEX SINUS-MAX PO) Take 2 tablets by mouth daily as needed.   Yes [provider]  ciprofloxacin (CIPRO) 500 MG tablet Take 1 tablet (500 mg total) by mouth 2 (two) times daily with a meal. Patient not taking: Reported on 01/17/2018 02/11/17   Wallis Bamberg, PA-C  ondansetron (ZOFRAN-ODT) 8 MG  disintegrating tablet Take 1 tablet (8 mg total) by mouth every 8 (eight) hours as needed for nausea. Patient not taking: Reported on 01/17/2018 02/06/17   Wallis Bamberg, PA-C    Family History Family History  Problem Relation Age of Onset  . Cancer Mother   . Hypertension Mother   . Mental retardation Mother   . Heart disease Mother   . Diabetes Father   . Diabetes Maternal Grandfather     Social History Social History   Tobacco Use  . Smoking status: Current Every Day Smoker    Packs/day: 0.50    Years: 15.00    Pack years: 7.50    Types: Cigarettes  . Smokeless tobacco: Never Used  Substance Use Topics  . Alcohol use: Yes    Comment: occ  . Drug use: No     Allergies   Patient has no known allergies.   Review of Systems Review of Systems  Constitutional: Positive for diaphoresis. Negative for fever.  HENT: Negative for sore throat.   Eyes: Negative for visual disturbance.  Respiratory: Negative for cough and shortness of breath.   Cardiovascular: Positive for chest pain.  Gastrointestinal: Negative for abdominal pain, nausea and vomiting.  Genitourinary: Negative for difficulty urinating.  Musculoskeletal: Negative for back pain and neck pain.  Skin: Negative for rash.  Neurological: Negative for syncope and headaches.     Physical Exam Updated Vital Signs BP (!) 166/120 (BP Location: Right Arm) Comment: MD  Nataley Bahri aware, plan to f/u w/ PCP tmrw  Pulse 81   Temp 98.4 F (36.9 C) (Oral)   Resp 18   LMP 12/31/2017   SpO2 99%   Physical Exam  Constitutional: She is oriented to person, place, and time. She appears well-developed and well-nourished. No distress.  HENT:  Head: Normocephalic and atraumatic.  Eyes: Conjunctivae and EOM are normal.  Neck: Normal range of motion.  Cardiovascular: Normal rate, regular rhythm, normal heart sounds and intact distal pulses. Exam reveals no gallop and no friction rub.  No murmur heard. Pulmonary/Chest: Effort  normal and breath sounds normal. No respiratory distress. She has no wheezes. She has no rales.  Abdominal: Soft. She exhibits no distension. There is no tenderness. There is no guarding.  Musculoskeletal: She exhibits no edema or tenderness.  Neurological: She is alert and oriented to person, place, and time.  Skin: Skin is warm and dry. No rash noted. She is not diaphoretic. No erythema.  Nursing note and vitals reviewed.    ED Treatments / Results  Labs (all labs ordered are listed, but only abnormal results are displayed) Labs Reviewed  BASIC METABOLIC PANEL  CBC  I-STAT TROPONIN, ED  I-STAT BETA HCG BLOOD, ED ([facility], [facility], [facility] ONLY)  I-STAT TROPONIN, ED  I-STAT TROPONIN, ED    EKG  EKG Interpretation  Date/Time:  Friday January 17 2018 17:04:09 EST Ventricular Rate:  101 PR Interval:  118 QRS Duration: 84 QT Interval:  348 QTC Calculation: 451 R Axis:   -16 Text Interpretation:  Sinus tachycardia Otherwise normal ECG No significant change since last tracing Confirmed by Alvira MondaySchlossman, Rian Koon ([id number]) on 01/17/2018 5:56:06 PM       Radiology Dg Chest 2 View  Result Date: 01/17/2018 CLINICAL DATA:  Chest pain and shortness of breath. EXAM: CHEST  2 VIEW COMPARISON:  Chest x-ray dated January 12, 2018. FINDINGS: The heart size and mediastinal contours are within normal limits. Both lungs are clear. The visualized skeletal structures are unremarkable. IMPRESSION: No active cardiopulmonary disease. Electronically Signed   By: Obie DredgeWilliam T Derry M.D.   On: 01/17/2018 17:35    Procedures Procedures (including critical care time)  Medications Ordered in ED Medications - No data to display   Initial Impression / Assessment and Plan / ED Course  I have reviewed the triage vital signs and the nursing notes.  Pertinent labs & imaging results that were available during my care of the patient were reviewed by me and considered in my medical decision making (see chart for details).        42 year old female with a history of hyperlipidemia, now resolved per patient with dietary changes, family history of cardiac disease, smoking, who presents with concern for intermittent chest pain over the last week.  EKG shows no significant changes.  Patient was initially tachycardic on arrival, but reports this was during an episode of pain which resolved, denies any shortness of breath, is low risk [place], with no asymmetric leg swelling, no estrogen use, no recent surgeries, no travel, no dyspnea, no hypoxia, and resolved tachycardia after initial presentation, and have low suspicion for pulmonary embolus.  History and physical exam is not consistent with aortic dissection.  Chest x-ray shows no evidence of pneumonia or pneumothorax.  CBC and BMP within normal limits.  Initial troponin 0 0.00, with repeat of 0.05.  Patient is low risk HEAR score of 3.  Checked a third troponin given increase from 0 to .05 which is essentially stable at .06.  She has been chest pain free in the ED with 3 negative troponins. Patient without CP now, no exertional symptoms, however feel given her family history and hx of smoking that Cardiology follow up and strict return precautions are appropriate.  Patient states understanding. Counseled in smoking cessation. Patient discharged in stable condition with understanding of reasons to return.     Final Clinical Impressions(s) / ED Diagnoses   Final diagnoses:  Chest pain, unspecified type    ED Discharge Orders    None       Alvira Monday, MD 01/18/18 [phone]

## 2018-01-17 NOTE — ED Triage Notes (Signed)
Pt presents to the ed for complaints of chest pain x 5 days.  States that she went to [place] but the wait was too long so she left.  Chest pain is centralized. Endorses shortness of breath associated with pain.

## 2018-01-18 ENCOUNTER — Ambulatory Visit: Payer: BLUE CROSS/BLUE SHIELD | Admitting: Family Medicine

## 2018-01-18 NOTE — Progress Notes (Deleted)
  No chief complaint on file.   HPI  4 review of systems  No past medical history on file.  Current Outpatient Medications  Medication Sig Dispense Refill  . Cholecalciferol (VITAMIN D3) 5000 units CAPS Take 5,000 Units by mouth daily.    . ciprofloxacin (CIPRO) 500 MG tablet Take 1 tablet (500 mg total) by mouth 2 (two) times daily with a meal. (Patient not taking: Reported on 01/17/2018) 10 tablet 0  . ondansetron (ZOFRAN-ODT) 8 MG disintegrating tablet Take 1 tablet (8 mg total) by mouth every 8 (eight) hours as needed for nausea. (Patient not taking: Reported on 01/17/2018) 15 tablet 0  . Phenylephrine-APAP-Guaifenesin (MUCINEX SINUS-MAX PO) Take 2 tablets by mouth daily as needed.     No current facility-administered medications for this visit.     Allergies: No Known Allergies  Past Surgical History:  Procedure Laterality Date  . CYST REMOVAL HAND     wrist , left   . TUBAL LIGATION      Social History   Socioeconomic History  . Marital status: Single    Spouse name: Not on file  . Number of children: Not on file  . Years of education: Not on file  . Highest education level: Not on file  Social Needs  . Financial resource strain: Not on file  . Food insecurity - worry: Not on file  . Food insecurity - inability: Not on file  . Transportation needs - medical: Not on file  . Transportation needs - non-medical: Not on file  Occupational History  . Not on file  Tobacco Use  . Smoking status: Current Every Day Smoker    Packs/day: 0.50    Years: 15.00    Pack years: 7.50    Types: Cigarettes  . Smokeless tobacco: Never Used  Substance and Sexual Activity  . Alcohol use: Yes    Comment: occ  . Drug use: No  . Sexual activity: Yes  Other Topics Concern  . Not on file  Social History Narrative  . Not on file    Family History  Problem Relation Age of Onset  . Cancer Mother   . Hypertension Mother   . Mental retardation Mother   . Heart disease Mother     . Diabetes Father   . Diabetes Maternal Grandfather      ROS Review of Systems See HPI Constitution: No fevers or chills No malaise No diaphoresis Skin: No rash or itching Eyes: no blurry vision, no double vision GU: no dysuria or hematuria Neuro: no dizziness or headaches * all others reviewed and negative   Objective: There were no vitals filed for this visit.  Physical Exam  Assessment and Plan There are no diagnoses linked to this encounter.   Brayah Urquilla P [employer]

## 2018-02-10 ENCOUNTER — Ambulatory Visit: Payer: BLUE CROSS/BLUE SHIELD | Admitting: Family Medicine

## 2018-02-10 ENCOUNTER — Other Ambulatory Visit: Payer: Self-pay

## 2018-02-10 ENCOUNTER — Encounter: Payer: Self-pay | Admitting: Family Medicine

## 2018-02-10 VITALS — BP 134/95 | HR 128 | Temp 99.2°F | Resp 17 | Ht 65.0 in | Wt 174.0 lb

## 2018-02-10 DIAGNOSIS — F17200 Nicotine dependence, unspecified, uncomplicated: Secondary | ICD-10-CM | POA: Diagnosis not present

## 2018-02-10 DIAGNOSIS — R000 Tachycardia, unspecified: Secondary | ICD-10-CM

## 2018-02-10 DIAGNOSIS — K219 Gastro-esophageal reflux disease without esophagitis: Secondary | ICD-10-CM

## 2018-02-10 MED ORDER — RANITIDINE HCL 150 MG PO TABS: 150.0000 mg | ORAL_TABLET | Freq: Two times a day (BID) | ORAL | 3 refills | Status: AC

## 2018-02-10 MED ORDER — OMEPRAZOLE 20 MG PO CPDR: 20.0000 mg | DELAYED_RELEASE_CAPSULE | Freq: Every day | ORAL | 3 refills | Status: DC

## 2018-02-10 NOTE — Progress Notes (Signed)
Chief Complaint  Patient presents with  . New Patient (Initial Visit)    establish care.  Episode of chest pain in middle of chest- usually when she takes a zantac it eases the pain some, hx of acid reflux, per pt keeps alot of gas/acid on her stomach, seen in ED and EKG was done and it was normal.  Ep    HPI   Pt reports that she has been to the hospital multiple times for chest pain that was substernal between her breasts She has been to the ER 01/1018 and 01/17/18 She states that she has been using zantac once a day and avoiding spicy foods She reports that she notices that if she does not eat she will notice the acid on her stomach She reports that she is burping and belching a lot She reports that she can feel the acid in the back of her throat when she lays down It makes her cough a lot She drinks beer 2-3 times a week 8-10 ounce bottles  Tobacco use She states that she smokes [place] cigarettes  She reports that she has been able cut down to only smoking 3 times a day     Past Medical History:  Diagnosis Date  . Anxiety   . Depression     Current Outpatient Medications  Medication Sig Dispense Refill  . Cholecalciferol (VITAMIN D3) 5000 units CAPS Take 5,000 Units by mouth daily.    Marland Kitchen. Phenylephrine-APAP-Guaifenesin (MUCINEX SINUS-MAX PO) Take 2 tablets by mouth daily as needed.    Marland Kitchen. omeprazole (PRILOSEC) 20 MG capsule Take 1 capsule (20 mg total) by mouth daily. 30 capsule 3  . ranitidine (ZANTAC) 150 MG tablet Take 1 tablet (150 mg total) by mouth 2 (two) times daily. 60 tablet 3   No current facility-administered medications for this visit.     Allergies: No Known Allergies  Past Surgical History:  Procedure Laterality Date  . CYST REMOVAL HAND     wrist , left   . TUBAL LIGATION      Social History   Socioeconomic History  . Marital status: Single    Spouse name: Not on file  . Number of children: Not on file  . Years of education: Not on file  .  Highest education level: Not on file  Occupational History  . Not on file  Social Needs  . Financial resource strain: Not on file  . Food insecurity:    Worry: Not on file    Inability: Not on file  . Transportation needs:    Medical: Not on file    Non-medical: Not on file  Tobacco Use  . Smoking status: Current Every Day Smoker    Packs/day: 0.50    Years: 15.00    Pack years: 7.50    Types: Cigarettes  . Smokeless tobacco: Never Used  Substance and Sexual Activity  . Alcohol use: Yes    Alcohol/week: 1.2 - 1.8 oz    Types: 2 - 3 Glasses of wine per week    Comment: occ  . Drug use: No  . Sexual activity: Yes  Lifestyle  . Physical activity:    Days per week: Not on file    Minutes per session: Not on file  . Stress: Not on file  Relationships  . Social connections:    Talks on phone: Not on file    Gets together: Not on file    Attends religious service: Not on file    Active  member of club or organization: Not on file    Attends meetings of clubs or organizations: Not on file    Relationship status: Not on file  Other Topics Concern  . Not on file  Social History Narrative  . Not on file    Family History  Problem Relation Age of Onset  . Cancer Mother   . Hypertension Mother   . Mental retardation Mother   . Heart disease Mother   . Diabetes Father   . Diabetes Maternal Grandfather      ROS Review of Systems See HPI Constitution: No fevers or chills No malaise No diaphoresis Skin: No rash or itching Eyes: no blurry vision, no double vision GU: no dysuria or hematuria Neuro: no dizziness or headaches all others reviewed and negative   Objective: Vitals:   02/10/18 1409  BP: (!) 134/95  Pulse: (!) 128  Resp: 17  Temp: 99.2 F (37.3 C)  TempSrc: Oral  SpO2: 97%  Weight: 174 lb (78.9 kg)  Height: 5\' 5"  (1.651 m)    Physical Exam Physical Exam  Constitutional: She is oriented to person, place, and time. She appears well-developed and  well-nourished.  HENT:  Head: Normocephalic and atraumatic.  Eyes: Conjunctivae and EOM are normal.  Cardiovascular: Normal rate, regular rhythm and normal heart sounds.   Pulmonary/Chest: Effort normal and breath sounds normal. No respiratory distress. She has no wheezes.  Abdominal: Normal appearance and bowel sounds are normal. There is no tenderness. There is no epigastric or CVA tenderness.   Murphy's sign absent and Mcburney's point nontender. Neurological: She is alert and oriented to person, place, and time.     Component     Latest Ref Rng & Units 01/17/2018  Sodium     135 - 145 mmol/L 136  Potassium     3.5 - 5.1 mmol/L 4.2  Chloride     101 - 111 mmol/L 102  CO2     22 - 32 mmol/L 23  Glucose     65 - 99 mg/dL 98  BUN     6 - 20 mg/dL 6  Creatinine     1.61 - 1.00 mg/dL 0.96  Calcium     8.9 - 10.3 mg/dL 9.3  GFR, Est Non African American     >60 mL/min >60  GFR, Est African American     >60 mL/min >60  Anion gap     5 - 15 11   Component     Latest Ref Rng & Units 01/17/2018  WBC     4.0 - 10.5 K/uL 8.8  RBC     3.87 - 5.11 MIL/uL 4.55  Hemoglobin     12.0 - 15.0 g/dL 04.5  HCT     40.9 - 81.1 % 41.6  MCV     78.0 - 100.0 fL 91.4  MCH     26.0 - 34.0 pg 30.1  MCHC     30.0 - 36.0 g/dL 91.4  RDW     78.2 - 95.6 % 13.5  Platelets     150 - 400 K/uL 245   Troponin neg x 3 01/17/2018   Assessment and Plan [place] was seen today for new patient (initial visit).  Diagnoses and all orders for this visit:  Gastroesophageal reflux disease without esophagitis- will check h. Pylori If negative pt is to quit smoking, cut back on triggers and take 6weeks of ppi with zantac prn -     H. pylori breath test;  Future  Tobacco use disorder- smoking cessation advised Pt in precontemplation  Tachycardia- advised to decrease the intake of caffeine, reviewed previous ecgs and troponins from the ER  Other orders -     omeprazole (PRILOSEC) 20 MG capsule;  Take 1 capsule (20 mg total) by mouth daily. -     ranitidine (ZANTAC) 150 MG tablet; Take 1 tablet (150 mg total) by mouth 2 (two) times daily.     Milton Streicher A Kajsa Butrum

## 2018-02-10 NOTE — Patient Instructions (Addendum)
Saturday return for fasting lab Nothing to eat, only sips of water to drink     IF you received an x-ray today, you will receive an invoice from [facility]. Please contact [facility] at [phone] with questions or concerns regarding your invoice.   IF you received labwork today, you will receive an invoice from [facility]. Please contact [facility] at [phone] with questions or concerns regarding your invoice.   Our billing staff will not be able to assist you with questions regarding bills from these companies.  You will be contacted with the lab results as soon as they are available. The fastest way to get your results is to activate your My Chart account. Instructions are located on the last page of this paperwork. If you have not heard from us regarding the results in 2 weeks, please contact this office.    Helicobacter Pylori Antibodies Test Why am I having this test? This is a blood test that looks for bacteria called Helicobacter pylori (H. pylori). H. pylori is a germ that can be found in the cells that line the stomach. Having high levels of H. pylori in your stomach puts you at risk for stomach ulcers and small bowel ulcers, long-term (chronic) inflammation of the lining of the stomach, or even ulcers that may occur in the canal that runs from the mouth to the stomach (esophagus). Presence of H. pylori can also increase your risk for stomach cancer if it is left untreated. Most people with H. pylori in their stomach bacteria have no symptoms. Your health care provider may ask you to have this test if you have symptoms of a stomach ulcer or small bowel ulcer, such as stomach pain before or after eating, heartburn, or nausea repeatedly after eating. What kind of sample is taken? A blood sample is required for this test. It is usually collected by inserting a needle into a vein or sticking a finger with a small needle. How do I prepare for this test? There is no  preparation required for this test. What are the reference ranges? Reference ranges are considered healthy ranges established after testing a large group of healthy people. Reference ranges may vary among different people, labs, and hospitals. It is your responsibility to obtain your test results. Ask the lab or department performing the test when and how you will get your results. Your test results will be reported as positive, negative, or equivocal. Equivocal means that your results are neither positive nor negative. References ranges for these results are as follows:  Less than or equal to 30 units/mL. This is negative.  Greater than or equal to 40 units/mL. This is positive.  30.01-39.99 units/mL. This is equivocal.  What do the results mean? Test results that are higher than normal may indicate numerous health conditions. These may include:  Short-term or long-term irritation of the stomach lining (gastritis).  Small bowel ulcer.  Stomach ulcer.  Stomach cancer.  Talk with your health care provider to discuss your results, treatment options, and if necessary, the need for more tests. Talk with your health care provider if you have any questions about your results. Talk with your health care provider to discuss your results, treatment options, and if necessary, the need for more tests. Talk with your health care provider if you have any questions about your results. This information is not intended to replace advice given to you by your health care provider. Make sure you discuss any questions you have with your health  care provider. Document Released: 12/13/2004 Document Revised: 07/25/2016 Document Reviewed: 04/02/2014 Elsevier Interactive Patient Education  2018 [employer].

## 2018-02-15 ENCOUNTER — Ambulatory Visit (INDEPENDENT_AMBULATORY_CARE_PROVIDER_SITE_OTHER): Payer: BLUE CROSS/BLUE SHIELD | Admitting: Family Medicine

## 2018-02-15 DIAGNOSIS — K219 Gastro-esophageal reflux disease without esophagitis: Secondary | ICD-10-CM | POA: Diagnosis not present

## 2018-02-15 NOTE — Progress Notes (Signed)
Lab Only Visit 

## 2018-02-18 LAB — H. PYLORI BREATH TEST: H pylori Breath Test: NEGATIVE

## 2018-03-01 ENCOUNTER — Ambulatory Visit: Payer: BLUE CROSS/BLUE SHIELD | Admitting: Family Medicine

## 2018-03-01 ENCOUNTER — Encounter: Payer: Self-pay | Admitting: Family Medicine

## 2018-03-01 VITALS — BP 132/82 | HR 98 | Temp 98.0°F | Resp 16 | Ht 65.0 in | Wt 176.0 lb

## 2018-03-01 DIAGNOSIS — R030 Elevated blood-pressure reading, without diagnosis of hypertension: Secondary | ICD-10-CM | POA: Diagnosis not present

## 2018-03-01 DIAGNOSIS — K219 Gastro-esophageal reflux disease without esophagitis: Secondary | ICD-10-CM | POA: Diagnosis not present

## 2018-03-01 DIAGNOSIS — F17200 Nicotine dependence, unspecified, uncomplicated: Secondary | ICD-10-CM | POA: Diagnosis not present

## 2018-03-01 MED ORDER — DEXLANSOPRAZOLE 30 MG PO CPDR: 30.0000 mg | DELAYED_RELEASE_CAPSULE | Freq: Every day | ORAL | 0 refills | Status: DC

## 2018-03-01 NOTE — Progress Notes (Signed)
Chief Complaint  Patient presents with  . Gastroesophageal Reflux    pt was seen in the office on 3/16 for  heart burn, pt states since her last appointment the heart burn has been worst. Pt states everytime she eats a meal the pain starts.     HPI  GERD Pt reports that she is eating small meals and chewing the foot to a pulp She did not see any improvement with omeprazole She took some of her sister's dexilant and is still using that with zantac and that seemed to help She is still smoking She is not drinking any sodas and is eating a bland diet. She reports that she had a full work up at her ER visit and was told it is not related to her heart She gets burning, belching and bloating  Elevated Blood Pressure Pt reports that she has nervousness about the doctor She denies chest pains, palpitations, shortness of breath She smokes a half pack per day of cigarettes She avoids salty foods BP Readings from Last 3 Encounters:  03/01/18 132/82  02/10/18 (!) 134/95  01/18/18 (!) 166/120     Past Medical History:  Diagnosis Date  . Anxiety   . Depression     Current Outpatient Medications  Medication Sig Dispense Refill  . Cholecalciferol (VITAMIN D3) 5000 units CAPS Take 5,000 Units by mouth daily.    Marland Kitchen Phenylephrine-APAP-Guaifenesin (MUCINEX SINUS-MAX PO) Take 2 tablets by mouth daily as needed.    . ranitidine (ZANTAC) 150 MG tablet Take 1 tablet (150 mg total) by mouth 2 (two) times daily. 60 tablet 3  . Dexlansoprazole (DEXILANT) 30 MG capsule Take 1 capsule (30 mg total) by mouth daily. 30 capsule 0  . omeprazole (PRILOSEC) 20 MG capsule Take 1 capsule (20 mg total) by mouth daily. (Patient not taking: Reported on 03/01/2018) 30 capsule 3   No current facility-administered medications for this visit.     Allergies: No Known Allergies  Past Surgical History:  Procedure Laterality Date  . CYST REMOVAL HAND     wrist , left   . TUBAL LIGATION      Social History    Socioeconomic History  . Marital status: Single    Spouse name: Not on file  . Number of children: Not on file  . Years of education: Not on file  . Highest education level: Not on file  Occupational History  . Not on file  Social Needs  . Financial resource strain: Not on file  . Food insecurity:    Worry: Not on file    Inability: Not on file  . Transportation needs:    Medical: Not on file    Non-medical: Not on file  Tobacco Use  . Smoking status: Current Every Day Smoker    Packs/day: 0.50    Years: 15.00    Pack years: 7.50    Types: Cigarettes  . Smokeless tobacco: Never Used  Substance and Sexual Activity  . Alcohol use: Yes    Alcohol/week: 1.2 - 1.8 oz    Types: 2 - 3 Glasses of wine per week    Comment: occ  . Drug use: No  . Sexual activity: Yes  Lifestyle  . Physical activity:    Days per week: Not on file    Minutes per session: Not on file  . Stress: Not on file  Relationships  . Social connections:    Talks on phone: Not on file    Gets together: Not on  file    Attends religious service: Not on file    Active member of club or organization: Not on file    Attends meetings of clubs or organizations: Not on file    Relationship status: Not on file  Other Topics Concern  . Not on file  Social History Narrative  . Not on file    Family History  Problem Relation Age of Onset  . Cancer Mother   . Hypertension Mother   . Mental retardation Mother   . Heart disease Mother   . Diabetes Father   . Diabetes Maternal Grandfather      ROS Review of Systems See HPI Constitution: No fevers or chills No malaise No diaphoresis Skin: No rash or itching Eyes: no blurry vision, no double vision GU: no dysuria or hematuria Neuro: no dizziness or headaches all others reviewed and negative   Objective: Vitals:   03/01/18 1434 03/01/18 1503  BP: (!) 160/88 132/82  Pulse: 98   Resp: 16   Temp: 98 F (36.7 C)   TempSrc: Oral   SpO2: 98%    Weight: 176 lb (79.8 kg)   Height: 5\' 5"  (1.651 m)     Physical Exam   Physical Exam  Constitutional: She is oriented to person, place, and time. She appears well-developed and well-nourished.  HENT:  Head: Normocephalic and atraumatic.  Eyes: Conjunctivae and EOM are normal.  Cardiovascular: Normal rate, regular rhythm and normal heart sounds.   Pulmonary/Chest: Effort normal and breath sounds normal. No respiratory distress. She has no wheezes.  Abdominal: Normal appearance and bowel sounds are normal. There is epigastric tenderness. There is no CVA tenderness.  Neurological: She is alert and oriented to person, place, and time.        Assessment and Plan Santana was seen today for gastroesophageal reflux.  Diagnoses and all orders for this visit:  Elevated bp without diagnosis of hypertension - rechecked bp and in prehypertensive range Dash diet advised  TOBACCO USE- Smoking cessation advised Pt to take the nicotine patches she already has -     Ambulatory referral to Gastroenterology  Gastroesophageal reflux disease without esophagitis -     Ambulatory referral to Gastroenterology -     Dexlansoprazole (DEXILANT) 30 MG capsule; Take 1 capsule (30 mg total) by mouth daily.   Discussed that her smoking and GERD increases her risk for erosive gastritis or GERD Changed omeprazole to dexilant and continue zantac Referral placed for GI for EGD  Nicole Defino A Creta LevinStallings

## 2018-03-01 NOTE — Patient Instructions (Addendum)
   IF you received an x-ray today, you will receive an invoice from [facility]. Please contact [facility] at [phone] with questions or concerns regarding your invoice.   IF you received labwork today, you will receive an invoice from [facility]. Please contact [facility] at [phone] with questions or concerns regarding your invoice.   Our billing staff will not be able to assist you with questions regarding bills from these companies.  You will be contacted with the lab results as soon as they are available. The fastest way to get your results is to activate your My Chart account. Instructions are located on the last page of this paperwork. If you have not heard from us regarding the results in 2 weeks, please contact this office.     Esophagogastroduodenoscopy Esophagogastroduodenoscopy (EGD) is a procedure to examine the lining of the esophagus, stomach, and first part of the small intestine (duodenum). This procedure is done to check for problems such as inflammation, bleeding, ulcers, or growths. During this procedure, a long, flexible, lighted tube with a camera attached (endoscope) is inserted down the throat. Tell a health care provider about:  Any allergies you have.  All medicines you are taking, including vitamins, herbs, eye drops, creams, and over-the-counter medicines.  Any problems you or family members have had with anesthetic medicines.  Any blood disorders you have.  Any surgeries you have had.  Any medical conditions you have.  Whether you are pregnant or may be pregnant. What are the risks? Generally, this is a safe procedure. However, problems may occur, including:  Infection.  Bleeding.  A tear (perforation) in the esophagus, stomach, or duodenum.  Trouble breathing.  Excessive sweating.  Spasms of the larynx.  A slowed heartbeat.  Low blood pressure.  What happens before the procedure?  Follow instructions from your  health care provider about eating or drinking restrictions.  Ask your health care provider about: ? Changing or stopping your regular medicines. This is especially important if you are taking diabetes medicines or blood thinners. ? Taking medicines such as aspirin and ibuprofen. These medicines can thin your blood. Do not take these medicines before your procedure if your health care provider instructs you not to.  Plan to have someone take you home after the procedure.  If you wear dentures, be ready to remove them before the procedure. What happens during the procedure?  To reduce your risk of infection, your health care team will wash or sanitize their hands.  An IV tube will be put in a vein in your hand or arm. You will get medicines and fluids through this tube.  You will be given one or more of the following: ? A medicine to help you relax (sedative). ? A medicine to numb the area (local anesthetic). This medicine may be sprayed into your throat. It will make you feel more comfortable and keep you from gagging or coughing during the procedure. ? A medicine for pain.  A mouth guard may be placed in your mouth to protect your teeth and to keep you from biting on the endoscope.  You will be asked to lie on your left side.  The endoscope will be lowered down your throat into your esophagus, stomach, and duodenum.  Air will be put into the endoscope. This will help your health care provider see better.  The lining of your esophagus, stomach, and duodenum will be examined.  Your health care provider may: ? Take a tissue sample so it   can be looked at in a lab (biopsy). ? Remove growths. ? Remove objects (foreign bodies) that are stuck. ? Treat any bleeding with medicines or other devices that stop tissue from bleeding. ? Widen (dilate) or stretch narrowed areas of your esophagus and stomach.  The endoscope will be taken out. The procedure may vary among health care providers and  hospitals. What happens after the procedure?  Your blood pressure, heart rate, breathing rate, and blood oxygen level will be monitored often until the medicines you were given have worn off.  Do not eat or drink anything until the numbing medicine has worn off and your gag reflex has returned. This information is not intended to replace advice given to you by your health care provider. Make sure you discuss any questions you have with your health care provider. Document Released: 03/22/2005 Document Revised: 04/26/2016 Document Reviewed: 10/13/2015 Elsevier Interactive Patient Education  2018 Elsevier Inc.  

## 2018-03-03 ENCOUNTER — Encounter: Payer: Self-pay | Admitting: Physician Assistant

## 2018-03-19 ENCOUNTER — Encounter: Payer: Self-pay | Admitting: Physician Assistant

## 2018-03-19 ENCOUNTER — Ambulatory Visit: Payer: BLUE CROSS/BLUE SHIELD | Admitting: Physician Assistant

## 2018-03-19 ENCOUNTER — Encounter (INDEPENDENT_AMBULATORY_CARE_PROVIDER_SITE_OTHER): Payer: Self-pay

## 2018-03-19 VITALS — BP 130/78 | HR 78 | Ht 64.0 in | Wt 181.0 lb

## 2018-03-19 DIAGNOSIS — K228 Other specified diseases of esophagus: Secondary | ICD-10-CM | POA: Diagnosis not present

## 2018-03-19 DIAGNOSIS — K2289 Other specified disease of esophagus: Secondary | ICD-10-CM

## 2018-03-19 DIAGNOSIS — K219 Gastro-esophageal reflux disease without esophagitis: Secondary | ICD-10-CM | POA: Diagnosis not present

## 2018-03-19 DIAGNOSIS — R1013 Epigastric pain: Secondary | ICD-10-CM | POA: Diagnosis not present

## 2018-03-19 MED ORDER — DEXLANSOPRAZOLE 60 MG PO CPDR: 60.0000 mg | DELAYED_RELEASE_CAPSULE | Freq: Every day | ORAL | 3 refills | Status: AC

## 2018-03-19 NOTE — Patient Instructions (Signed)
You have been scheduled for an endoscopy. Please follow written instructions given to you at your visit today. If you use inhalers (even only as needed), please bring them with you on the day of your procedure. Your physician has requested that you go to www.startemmi.com and enter the access code given to you at your visit today. This web site gives a general overview about your procedure. However, you should still follow specific instructions given to you by our office regarding your preparation for the procedure.  We have sent the following medications to your pharmacy for you to pick up at your convenience: Dexilant 60 mg daily   We have given you a handout on GERD.

## 2018-03-19 NOTE — Progress Notes (Signed)
Chief Complaint: GERD  HPI:    Sheila Scott is a 42 year old female with a past medical history as listed below, who was referred to me by Doristine Bosworth, MD for a complaint of GERD.     01/17/18 ED visit with chest pain.  EKG with no significant changes.  Troponin normal.  Cardiac etiology essentially ruled out.   03/01/18 PCP visit follow-up of heartburn, initially seen 3/16, patient described worsening of symptoms and no improvement with Omeprazole.  She was taking some of her sister's Dexilant and still using Zantac which seemed to help.  Started on Dexilant 30 mg daily and referred to our office.    Today, explains that at the beginning of February she had severe chest pain after roller skating, unsure what she ate around that time and presented to the ER as above.  Since then has had constant epigastric//esophageal burning which was initially un-helped by Omeprazole and is some better with Dexilant 30 mg daily and Zantac 150 mg around noon.  She does continue with a feeling of nausea and some esophageal pain as well as "acid on my stomach".  Describes coughing up a lot of phlegm.  Patient has altered her diet.  Worried that something is "eating through my stomach".    Denies fever, chills, blood in her stool, melena, weight loss, anorexia or symptoms that awaken her at night.  Past Medical History:  Diagnosis Date  . Anxiety   . Depression     Past Surgical History:  Procedure Laterality Date  . CYST REMOVAL HAND     wrist , left   . TUBAL LIGATION      Current Outpatient Medications  Medication Sig Dispense Refill  . Cholecalciferol (VITAMIN D3) 5000 units CAPS Take 5,000 Units by mouth daily.    Marland Kitchen Dexlansoprazole (DEXILANT) 30 MG capsule Take 1 capsule (30 mg total) by mouth daily. 30 capsule 0  . omeprazole (PRILOSEC) 20 MG capsule Take 1 capsule (20 mg total) by mouth daily. (Patient not taking: Reported on 03/01/2018) 30 capsule 3  . Phenylephrine-APAP-Guaifenesin (MUCINEX  SINUS-MAX PO) Take 2 tablets by mouth daily as needed.    . ranitidine (ZANTAC) 150 MG tablet Take 1 tablet (150 mg total) by mouth 2 (two) times daily. 60 tablet 3   No current facility-administered medications for this visit.     Allergies as of 03/19/2018  . (No Known Allergies)    Family History  Problem Relation Age of Onset  . Cancer Mother   . Hypertension Mother   . Mental retardation Mother   . Heart disease Mother   . Diabetes Father   . Diabetes Maternal Grandfather     Social History   Socioeconomic History  . Marital status: Single    Spouse name: Not on file  . Number of children: Not on file  . Years of education: Not on file  . Highest education level: Not on file  Occupational History  . Not on file  Social Needs  . Financial resource strain: Not on file  . Food insecurity:    Worry: Not on file    Inability: Not on file  . Transportation needs:    Medical: Not on file    Non-medical: Not on file  Tobacco Use  . Smoking status: Current Every Day Smoker    Packs/day: 0.50    Years: 15.00    Pack years: 7.50    Types: Cigarettes  . Smokeless tobacco: Never Used  Substance  and Sexual Activity  . Alcohol use: Yes    Alcohol/week: 1.2 - 1.8 oz    Types: 2 - 3 Glasses of wine per week    Comment: occ  . Drug use: No  . Sexual activity: Yes  Lifestyle  . Physical activity:    Days per week: Not on file    Minutes per session: Not on file  . Stress: Not on file  Relationships  . Social connections:    Talks on phone: Not on file    Gets together: Not on file    Attends religious service: Not on file    Active member of club or organization: Not on file    Attends meetings of clubs or organizations: Not on file    Relationship status: Not on file  . Intimate partner violence:    Fear of current or ex partner: Not on file    Emotionally abused: Not on file    Physically abused: Not on file    Forced sexual activity: Not on file  Other  Topics Concern  . Not on file  Social History Narrative  . Not on file    Review of Systems:    Constitutional: No weight loss, fever or chills Skin: No rash  Cardiovascular: +chest pain Respiratory: No SOB  Gastrointestinal: See HPI and otherwise negative Genitourinary: No dysuria  Neurological: No headache Musculoskeletal: No new muscle or joint pain Hematologic: No bleeding  Psychiatric: No history of depression or anxiety   Physical Exam:  Vital signs: BP 130/78   Pulse 78   Ht 5\' 4"  (1.626 m)   Wt 181 lb (82.1 kg)   BMI 31.07 kg/m    Constitutional:   Pleasant overweight AA female appears to be in NAD, Well developed, Well nourished, alert and cooperative Head:  Normocephalic and atraumatic. Eyes:   PEERL, EOMI. No icterus. Conjunctiva pink. Ears:  Normal auditory acuity. Neck:  Supple Throat: Oral cavity and pharynx without inflammation, swelling or lesion.  Respiratory: Respirations even and unlabored. Lungs clear to auscultation bilaterally.   No wheezes, crackles, or rhonchi.  Cardiovascular: Normal S1, S2. No MRG. Regular rate and rhythm. No peripheral edema, cyanosis or pallor.  Gastrointestinal:  Soft, nondistended, nontender. No rebound or guarding. Normal bowel sounds. No appreciable masses or hepatomegaly. Rectal:  Not performed.  Msk:  Symmetrical without gross deformities. Without edema, no deformity or joint abnormality.  Neurologic:  Alert and  oriented x4;  grossly normal neurologically.  Skin:   Dry and intact without significant lesions or rashes. Psychiatric: Demonstrates good judgement and reason without abnormal affect or behaviors.  MOST RECENT LABS AND IMAGING: CBC    Component Value Date/Time   WBC 8.8 01/17/2018 1712   RBC 4.55 01/17/2018 1712   HGB 13.7 01/17/2018 1712   HCT 41.6 01/17/2018 1712   PLT 245 01/17/2018 1712   MCV 91.4 01/17/2018 1712   MCV 88.9 02/06/2017 1409   MCH 30.1 01/17/2018 1712   MCHC 32.9 01/17/2018 1712    RDW 13.5 01/17/2018 1712   LYMPHSABS 3.7 09/30/2013 0110   MONOABS 0.5 09/30/2013 0110   EOSABS 0.1 09/30/2013 0110   BASOSABS 0.0 09/30/2013 0110    CMP     Component Value Date/Time   NA 136 01/17/2018 1712   K 4.2 01/17/2018 1712   CL 102 01/17/2018 1712   CO2 23 01/17/2018 1712   GLUCOSE 98 01/17/2018 1712   BUN 6 01/17/2018 1712   CREATININE 0.77 01/17/2018 1712  CREATININE 0.72 08/06/2012 1605   CALCIUM 9.3 01/17/2018 1712   PROT 7.5 08/06/2012 1605   ALBUMIN 4.6 08/06/2012 1605   AST 19 08/06/2012 1605   ALT 21 08/06/2012 1605   ALKPHOS 46 08/06/2012 1605   BILITOT 0.5 08/06/2012 1605   GFRNONAA >60 01/17/2018 1712   GFRAA >60 01/17/2018 1712   02/15/18 H. pylori breath test normal.  Assessment: 1.  GERD: Over the past 2 months with associated epigastric/esophageal pain, some better on Dexilant 30 mg + Zantac 150 milligrams daily 2.  Epigastric pain/esophageal pain: See above, likely all related to gastritis/GERD but question PUD with severe pain which sent patient to ER initially  Plan: 1.  Patient with 2 months of reflux symptoms some better now on Dexilant 30 mg daily as well as Zantac 150 daily.  Patient concerned about damage to her stomach. 2.  Sched[facility] patient [facility]  Did discuss risk, benefits, limitations and alternatives and patient agrees to proceed. 3.  Dexilant increased to 60 mg daily #30 with 3 refills 4.  Hopefully patient will be able to discontinue Zantac with increase in Dexilant. 5.  Reviewed antireflux diet and lifestyle modifications. 6.  Patient to follow in clinic per recommendations from Dr. Myrtie Neitheranis after time of procedure.  Hyacinth MeekerJennifer Hieu Herms, PA-C [facility] 03/19/2018, 2:09 PM  Cc: Doristine BosworthStallings, Zoe A, MD

## 2018-03-20 NOTE — Progress Notes (Signed)
Thank you for sending this case to me. I have reviewed the entire note, and the outlined plan seems appropriate.  This chest pain sounds like it may not even be caused by reflux, since it is not appreciable better on potent anti-secretory therapy.  If EGD unremarkable, I will most likely pursue pH and impedance testing off PPI.  Amada JupiterHenry Danis, MD

## 2018-03-24 ENCOUNTER — Encounter: Payer: Self-pay | Admitting: Gastroenterology

## 2018-03-26 ENCOUNTER — Ambulatory Visit: Payer: BLUE CROSS/BLUE SHIELD | Admitting: Family Medicine

## 2018-03-26 ENCOUNTER — Encounter: Payer: Self-pay | Admitting: Family Medicine

## 2018-03-26 ENCOUNTER — Other Ambulatory Visit: Payer: Self-pay

## 2018-03-26 VITALS — BP 130/78 | HR 89 | Temp 98.9°F | Resp 17 | Ht 64.0 in | Wt 180.6 lb

## 2018-03-26 DIAGNOSIS — F17200 Nicotine dependence, unspecified, uncomplicated: Secondary | ICD-10-CM | POA: Diagnosis not present

## 2018-03-26 DIAGNOSIS — R030 Elevated blood-pressure reading, without diagnosis of hypertension: Secondary | ICD-10-CM

## 2018-03-26 DIAGNOSIS — K219 Gastro-esophageal reflux disease without esophagitis: Secondary | ICD-10-CM

## 2018-03-26 NOTE — Progress Notes (Signed)
Chief Complaint  Patient presents with  . Gastroesophageal Reflux    follow up    HPI GERD She reports that she is taking dexilant 60mg  since GI increased her dose to 2 tabs eat time Her symptoms improved with changing her diet, dexilant and drinkig water She has a scope coming up next week  TOBACCO USE She is still smoking She reports that she is thinkiung about quitting and wants to cut down first She has been smoking for 16 years about a pack a day She cannot smoke at work She has never quit She does not have a quit date  ELEVATED BP WITHOUT DIAGNOSIS OF HYPERTENSION She has elevated bp today She reports that she smoked a cigarette on the way over here  BP Readings from Last 3 Encounters:  03/26/18 130/78  03/19/18 130/78  03/01/18 132/82      Past Medical History:  Diagnosis Date  . Anxiety   . Depression   . GERD (gastroesophageal reflux disease)     Current Outpatient Medications  Medication Sig Dispense Refill  . Cholecalciferol (VITAMIN D3) 5000 units CAPS Take 5,000 Units by mouth daily.    Marland Kitchen dexlansoprazole (DEXILANT) 60 MG capsule Take 1 capsule (60 mg total) by mouth daily. 90 capsule 3  . ranitidine (ZANTAC) 150 MG tablet Take 1 tablet (150 mg total) by mouth 2 (two) times daily. 60 tablet 3   No current facility-administered medications for this visit.     Allergies: No Known Allergies  Past Surgical History:  Procedure Laterality Date  . CYST REMOVAL HAND     wrist , left   . TUBAL LIGATION      Social History   Socioeconomic History  . Marital status: Single    Spouse name: Not on file  . Number of children: 2  . Years of education: Not on file  . Highest education level: Not on file  Occupational History  . Occupation: Pharmacist, hospital Needs  . Financial resource strain: Not on file  . Food insecurity:    Worry: Not on file    Inability: Not on file  . Transportation needs:    Medical: Not on file    Non-medical: Not on  file  Tobacco Use  . Smoking status: Current Every Day Smoker    Packs/day: 0.50    Years: 15.00    Pack years: 7.50    Types: Cigarettes  . Smokeless tobacco: Never Used  . Tobacco comment: Tobacco info given 03/19/18  Substance and Sexual Activity  . Alcohol use: Yes    Alcohol/week: 1.2 - 1.8 oz    Types: 2 - 3 Glasses of wine per week    Comment: occ  . Drug use: No  . Sexual activity: Yes  Lifestyle  . Physical activity:    Days per week: Not on file    Minutes per session: Not on file  . Stress: Not on file  Relationships  . Social connections:    Talks on phone: Not on file    Gets together: Not on file    Attends religious service: Not on file    Active member of club or organization: Not on file    Attends meetings of clubs or organizations: Not on file    Relationship status: Not on file  Other Topics Concern  . Not on file  Social History Narrative  . Not on file    Family History  Problem Relation Age of Onset  . Hypertension  Mother   . Heart disease Mother   . Anxiety disorder Mother        panic attacks  . Diabetes Father   . Diabetes Paternal Grandmother   . Colon cancer Neg Hx   . Esophageal cancer Neg Hx   . Rectal cancer Neg Hx      ROS Review of Systems See HPI Constitution: No fevers or chills No malaise No diaphoresis Skin: No rash or itching Eyes: no blurry vision, no double vision GU: no dysuria or hematuria Neuro: no dizziness or headaches all others reviewed and negative   Objective: Vitals:   03/26/18 1612 03/26/18 1646  BP: (!) 150/115 130/78  Pulse: 89   Resp: 17   Temp: 98.9 F (37.2 C)   TempSrc: Oral   SpO2: 98%   Weight: 180 lb 9.6 oz (81.9 kg)   Height: 5\' 4"  (1.626 m)     Physical Exam  Assessment and Plan Dayanara was seen today for gastroesophageal reflux.  Diagnoses and all orders for this visit:  TOBACCO USE- discussed hypnosis and gave some resources Discussed nicotine replacements  Elevated BP  without diagnosis of hypertension -  Discussed that smoking is causing her prehypertension and encouraged cessation  Gastroesophageal reflux disease without esophagitis -  Follow GI recs  Smoking cessation advised    Jaskirat Schwieger A Creta LevinStallings

## 2018-03-26 NOTE — Patient Instructions (Addendum)
[facility] : [facility]  [facility], [address], [place], [place] [id number]  [address] [place], [place], [place]   [phone]     IF you received an x-ray today, you will receive an invoice from [facility]. Please contact [facility] at [phone] with questions or concerns regarding your invoice.   IF you received labwork today, you will receive an invoice from [facility]. Please contact [facility] at [phone] with questions or concerns regarding your invoice.   Our billing staff will not be able to assist you with questions regarding bills from these companies.  You will be contacted with the lab results as soon as they are available. The fastest way to get your results is to activate your My Chart account. Instructions are located on the last page of this paperwork. If you have not heard from us regarding the results in 2 weeks, please contact this office.     Smoking Tobacco Information Smoking tobacco will very likely harm your health. Tobacco contains a poisonous (toxic), colorless chemical called nicotine. Nicotine affects the brain and makes tobacco addictive. This change in your brain can make it hard to stop smoking. Tobacco also has other toxic chemicals that can hurt your body and raise your risk of many cancers. How can smoking tobacco affect me? Smoking tobacco can increase your chances of having serious health conditions, such as:  Cancer. Smoking is most commonly associated with lung cancer, but can lead to cancer in other parts of the body.  Chronic obstructive pulmonary disease (COPD). This is a long-term lung condition that makes it hard to breathe. It also gets worse over time.  High blood pressure (hypertension), heart disease, stroke, or heart attack.  Lung infections, such as pneumonia.  Cataracts. This is when the lenses in the eyes become clouded.  Digestive problems. This may include  peptic ulcers, heartburn, and gastroesophageal reflux disease (GERD).  Oral health problems, such as gum disease and tooth loss.  Loss of taste and smell.  Smoking can affect your appearance by causing:  Wrinkles.  Yellow or stained teeth, fingers, and fingernails.  Smoking tobacco can also affect your social life.  Many workplaces, [employer], hotels, and public places are tobacco-free. This means that you may experience challenges in finding places to smoke when away from home.  The cost of a smoking habit can be expensive. Expenses for someone who smokes come in two ways: ? You spend money on a regular basis to buy tobacco. ? Your health care costs in the long-term are higher if you smoke.  Tobacco smoke can also affect the health of those around you. Children of smokers have greater chances of: ? Sudden infant death syndrome (SIDS). ? Ear infections. ? Lung infections.  What lifestyle changes can be made?  Do not start smoking. Quit if you already do.  To quit smoking: ? Make a plan to quit smoking and commit yourself to it. Look for programs to help you and ask your health care provider for recommendations and ideas. ? Talk with your health care provider about using nicotine replacement medicines to help you quit. Medicine replacement medicines include gum, lozenges, patches, sprays, or pills. ? Do not replace cigarette smoking with electronic cigarettes, which are commonly called e-cigarettes. The safety of e-cigarettes is not known, and some may contain harmful chemicals. ? Avoid places, people, or situations that tempt you to smoke. ? If you try to quit but return to smoking, don't  give up hope. It is very common for people to try a number of times before they fully succeed. When you feel ready again, give it another try.  Quitting smoking might affect the way you eat as well as your weight. Be prepared to monitor your eating habits. Get support in planning and following  a healthy diet.  Ask your health care provider about having regular tests (screenings) to check for cancer. This may include blood tests, imaging tests, and other tests.  Exercise regularly. Consider taking walks, joining a gym, or doing yoga or exercise classes.  Develop skills to manage your stress. These skills include meditation. What are the benefits of quitting smoking? By quitting smoking, you may:  Lower your risk of getting cancer and other diseases caused by smoking.  Live longer.  Breathe better.  Lower your blood pressure and heart rate.  Stop your addiction to tobacco.  Stop creating secondhand smoke that hurts other people.  Improve your sense of taste and smell.  Look better over time, due to having fewer wrinkles and less staining.  What can happen if changes are not made? If you do not stop smoking, you may:  Get cancer and other diseases.  Develop COPD or other long-term (chronic) lung conditions.  Develop serious problems with your heart and blood vessels (cardiovascular system).  Need more tests to screen for problems caused by smoking.  Have higher, long-term healthcare costs from medicines or treatments related to smoking.  Continue to have worsening changes in your lungs, mouth, and nose.  Where to find support: To get support to quit smoking, consider:  Asking your health care provider for more information and resources.  Taking classes to learn more about quitting smoking.  Looking for local organizations that offer resources about quitting smoking.  Joining a support group for people who want to quit smoking in your local community.  Where to find more information: You may find more information about quitting smoking from:  HelpGuide.org: www.helpguide.org/articles/addictions/how-to-quit-smoking.htm  BankRights[employer]: smokefree.gov  American Lung Association: www.lung.org  Contact a health care provider if:  You have problems  breathing.  Your lips, nose, or fingers turn blue.  You have chest pain.  You are coughing up blood.  You feel faint or you pass out.  You have other noticeable changes that cause you to worry. Summary  Smoking tobacco can negatively affect your health, the health of those around you, your finances, and your social life.  Do not start smoking. Quit if you already do. If you need help quitting, ask your health care provider.  Think about joining a support group for people who want to quit smoking in your local community. There are many effective programs that will help you to quit this behavior. This information is not intended to replace advice given to you by your health care provider. Make sure you discuss any questions you have with your health care provider. Document Released: 12/04/2016 Document Revised: 12/04/2016 Document Reviewed: 12/04/2016 Elsevier Interactive Patient Education  [employer].

## 2018-03-31 ENCOUNTER — Other Ambulatory Visit: Payer: Self-pay | Admitting: Family Medicine

## 2018-04-04 ENCOUNTER — Ambulatory Visit (AMBULATORY_SURGERY_CENTER): Payer: BLUE CROSS/BLUE SHIELD | Admitting: Gastroenterology

## 2018-04-04 ENCOUNTER — Other Ambulatory Visit: Payer: Self-pay

## 2018-04-04 ENCOUNTER — Encounter: Payer: Self-pay | Admitting: Gastroenterology

## 2018-04-04 VITALS — BP 127/75 | HR 82 | Temp 98.0°F | Resp 18 | Ht 64.0 in | Wt 181.0 lb

## 2018-04-04 DIAGNOSIS — K2950 Unspecified chronic gastritis without bleeding: Secondary | ICD-10-CM | POA: Diagnosis not present

## 2018-04-04 DIAGNOSIS — K219 Gastro-esophageal reflux disease without esophagitis: Secondary | ICD-10-CM | POA: Diagnosis present

## 2018-04-04 DIAGNOSIS — R1013 Epigastric pain: Secondary | ICD-10-CM | POA: Diagnosis not present

## 2018-04-04 MED ORDER — SODIUM CHLORIDE 0.9 % IV SOLN: 500.0000 mL | Freq: Once | INTRAVENOUS | Status: AC

## 2018-04-04 NOTE — Progress Notes (Signed)
Called to room to assist during endoscopic procedure.  Patient ID and intended procedure confirmed with present staff. Received instructions for my participation in the procedure from the performing physician.  

## 2018-04-04 NOTE — Progress Notes (Signed)
To PACU, VSS. Report to [email or web address] 

## 2018-04-04 NOTE — Patient Instructions (Signed)
  THANK YOU FOR ALLOWING [place] TO CARE FOR YOU TODAY!  AWAIT PATHOLOGY RESULTS  RESUME DIET AND MEDICATIONS AS BEFORE.     YOU HAD AN ENDOSCOPIC PROCEDURE TODAY AT THE [facility]:   Refer to the procedure report that was given to you for any specific questions about what was found during the examination.  If the procedure report does not answer your questions, please call your gastroenterologist to clarify.  If you requested that your care partner not be given the details of your procedure findings, then the procedure report has been included in a sealed envelope for you to review at your convenience later.  YOU SHOULD EXPECT: Some feelings of bloating in the abdomen. Passage of more gas than usual.  Walking can help get rid of the air that was put into your GI tract during the procedure and reduce the bloating. If you had a lower endoscopy (such as a colonoscopy or flexible sigmoidoscopy) you may notice spotting of blood in your stool or on the toilet paper. If you underwent a bowel prep for your procedure, you may not have a normal bowel movement for a few days.  Please Note:  You might notice some irritation and congestion in your nose or some drainage.  This is from the oxygen used during your procedure.  There is no need for concern and it should clear up in a day or so.  SYMPTOMS TO REPORT IMMEDIATELY:    Following upper endoscopy (EGD)  Vomiting of blood or coffee ground material  New chest pain or pain under the shoulder blades  Painful or persistently difficult swallowing  New shortness of breath  Fever of 100F or higher  Black, tarry-looking stools  For urgent or emergent issues, a gastroenterologist can be reached at any hour by calling [phone].   DIET:  We do recommend a small meal at first, but then you may proceed to your regular diet.  Drink plenty of fluids but you should avoid alcoholic beverages for 24 hours.  ACTIVITY:  You should plan to take it  easy for the rest of today and you should NOT DRIVE or use heavy machinery until tomorrow (because of the sedation medicines used during the test).    FOLLOW UP: Our staff will call the number listed on your records the next business day following your procedure to check on you and address any questions or concerns that you may have regarding the information given to you following your procedure. If we do not reach you, we will leave a message.  However, if you are feeling well and you are not experiencing any problems, there is no need to return our call.  We will assume that you have returned to your regular daily activities without incident.  If any biopsies were taken you will be contacted by phone or by letter within the next 1-3 weeks.  Please call us at [phone] if you have not heard about the biopsies in 3 weeks.    SIGNATURES/CONFIDENTIALITY: You and/or your care partner have signed paperwork which will be entered into your electronic medical record.  These signatures attest to the fact that that the information above on your After Visit Summary has been reviewed and is understood.  Full responsibility of the confidentiality of this discharge information lies with you and/or your care-partner.

## 2018-04-04 NOTE — Op Note (Signed)
[facility] Patient Name: Sheila Scott Procedure Date: 04/04/2018 2:35 PM MRN: [id number] Endoscopist: Sherilyn Cooter L. Myrtie Neither , MD Age: 42 Referring MD:  Date of Birth: 1976/08/23 Gender: Female Account #: [id number] Procedure:                Upper GI endoscopy Indications:              Epigastric abdominal pain, Heartburn (persisting                            despite omeprazole, lately better on dexilant) Medicines:                Monitored Anesthesia Care Procedure:                Pre-Anesthesia Assessment:                           - Prior to the procedure, a History and Physical                            was performed, and patient medications and                            allergies were reviewed. The patient's tolerance of                            previous anesthesia was also reviewed. The risks                            and benefits of the procedure and the sedation                            options and risks were discussed with the patient.                            All questions were answered, and informed consent                            was obtained. Prior Anticoagulants: The patient has                            taken no previous anticoagulant or antiplatelet                            agents. ASA Grade Assessment: II - A patient with                            mild systemic disease. After reviewing the risks                            and benefits, the patient was deemed in                            satisfactory condition to undergo the procedure.  After obtaining informed consent, the endoscope was                            passed under direct vision. Throughout the                            procedure, the patient's blood pressure, pulse, and                            oxygen saturations were monitored continuously. The                            Endoscope was introduced through the mouth, and                            advanced  to the second part of duodenum. The upper                            GI endoscopy was accomplished without difficulty.                            The patient tolerated the procedure well. Scope In: Scope Out: Findings:                 The larynx was normal.                           The esophagus was normal.                           The entire examined stomach was normal. Biopsies                            were taken with a cold forceps for histology.                            (Sidney protocol).                           The cardia and gastric fundus were normal on                            retroflexion.                           The examined duodenum was normal. Complications:            No immediate complications. Estimated Blood Loss:     Estimated blood loss was minimal. Impression:               - Normal larynx.                           - Normal esophagus.                           - Normal stomach. Biopsied.                           -  Normal examined duodenum. Recommendation:           - Patient has a contact number available for                            emergencies. The signs and symptoms of potential                            delayed complications were discussed with the                            patient. Return to normal activities tomorrow.                            Written discharge instructions were provided to the                            patient.                           - Resume previous diet.                           - Continue present medications.                           - Discontinue the use of any products containing                            nicotine indefinitely. This is ESSENTIAL for                            long-term control of heartburn.                           - Await pathology results. Rye Dorado L. Myrtie Neither, MD 04/04/2018 3:05:53 PM This report has been signed electronically.

## 2018-04-07 ENCOUNTER — Telehealth: Payer: Self-pay | Admitting: *Deleted

## 2018-04-07 NOTE — Telephone Encounter (Signed)
  Follow up Call-  Call back number 04/04/2018  Post procedure Call Back phone  # [phone]  Permission to leave phone message Yes  Some recent data might be hidden     Patient questions:  Do you have a fever, pain , or abdominal swelling? No. Pain Score  0 *  Have you tolerated food without any problems? Yes.    Have you been able to return to your normal activities? Yes.    Do you have any questions about your discharge instructions: Diet   No. Medications  No. Follow up visit  No.  Do you have questions or concerns about your Care? No.  Actions: * If pain score is 4 or above: No action needed, pain <4.

## 2018-04-12 ENCOUNTER — Other Ambulatory Visit: Payer: Self-pay

## 2018-04-12 ENCOUNTER — Encounter: Payer: Self-pay | Admitting: Family Medicine

## 2018-04-12 ENCOUNTER — Ambulatory Visit: Payer: BLUE CROSS/BLUE SHIELD | Admitting: Family Medicine

## 2018-04-12 VITALS — BP 140/88 | HR 100 | Temp 99.0°F | Resp 16 | Wt 179.0 lb

## 2018-04-12 DIAGNOSIS — Z202 Contact with and (suspected) exposure to infections with a predominantly sexual mode of transmission: Secondary | ICD-10-CM

## 2018-04-12 DIAGNOSIS — Z124 Encounter for screening for malignant neoplasm of cervix: Secondary | ICD-10-CM

## 2018-04-12 DIAGNOSIS — A5901 Trichomonal vulvovaginitis: Secondary | ICD-10-CM

## 2018-04-12 LAB — POCT WET + KOH PREP
YEAST BY WET PREP: ABSENT
Yeast by KOH: ABSENT

## 2018-04-12 MED ORDER — METRONIDAZOLE 500 MG PO TABS: 2000.0000 mg | ORAL_TABLET | Freq: Once | ORAL | 0 refills | Status: AC

## 2018-04-12 NOTE — Patient Instructions (Addendum)
   IF you received an x-ray today, you will receive an invoice from [facility]. Please contact  Radiology at [phone] with questions or concerns regarding your invoice.   IF you received labwork today, you will receive an invoice from [facility]. Please contact [facility] at [phone] with questions or concerns regarding your invoice.   Our billing staff will not be able to assist you with questions regarding bills from these companies.  You will be contacted with the lab results as soon as they are available. The fastest way to get your results is to activate your My Chart account. Instructions are located on the last page of this paperwork. If you have not heard from us regarding the results in 2 weeks, please contact this office.      Trichomoniasis Trichomoniasis is an STI (sexually transmitted infection) that can affect both women and men. In women, the outer area of the female genitalia (vulva) and the vagina are affected. In men, the penis is mainly affected, but the prostate and other reproductive organs can also be involved. This condition can be treated with medicine. It often has no symptoms (is asymptomatic), especially in men. What are the causes? This condition is caused by an organism called Trichomonas vaginalis. Trichomoniasis most often spreads from person to person (is contagious) through sexual contact. What increases the risk? The following factors may make you more likely to develop this condition:  Having unprotected sexual intercourse.  Having sexual intercourse with a partner who has trichomoniasis.  Having multiple sexual partners.  Having had previous trichomoniasis infections or other STIs.  What are the signs or symptoms? In women, symptoms of trichomoniasis include:  Abnormal vaginal discharge that is clear, white, gray, or yellow-green and foamy and has an unusual "fishy" odor.  Itching and irritation of the vagina and  vulva.  Burning or pain during urination or sexual intercourse.  Genital redness and swelling.  In men, symptoms of trichomoniasis include:  Penile discharge that may be foamy or contain pus.  Pain in the penis. This may happen only when urinating.  Itching or irritation inside the penis.  Burning after urination or ejaculation.  How is this diagnosed? In women, this condition may be found during a routine Pap test or physical exam. It may be found in men during a routine physical exam. Your health care provider may perform tests to help diagnose this infection, such as:  Urine tests (men and women).  The following in women: ? Testing the pH of the vagina. ? A vaginal swab test that checks for the Trichomonas vaginalis organism. ? Testing vaginal secretions.  Your health care provider may test you for other STIs, including HIV (human immunodeficiency virus). How is this treated? This condition is treated with medicine taken by mouth (orally), such as metronidazole or tinidazole to fight the infection. Your sexual partner(s) may also need to be tested and treated.  If you are a woman and you plan to become pregnant or think you may be pregnant, tell your health care provider right away. Some medicines that are used to treat the infection should not be taken during pregnancy.  Your health care provider may recommend over-the-counter medicines or creams to help relieve itching or irritation. You may be tested for infection again 3 months after treatment. Follow these instructions at home:  Take and use over-the-counter and prescription medicines, including creams, only as told by your health care provider.  Do not have sexual intercourse until one week after   you finish your medicine, or until your health care provider approves. Ask your health care provider when you may resume sexual intercourse.  (Women) Do not douche or wear tampons while you have the infection.  Discuss your  infection with your sexual partner(s). Make sure that your partner gets tested and treated, if necessary.  Keep all follow-up visits as told by your health care provider. This is important. How is this prevented?  Use condoms every time you have sex. Using condoms correctly and consistently can help protect against STIs.  Avoid having multiple sexual partners.  Talk with your sexual partner about any symptoms that either of you may have, as well as any history of STIs.  Get tested for STIs and STDs (sexually transmitted diseases) before you have sex. Ask your partner to do the same.  Do not have sexual contact if you have symptoms of trichomoniasis or another STI. Contact a health care provider if:  You still have symptoms after you finish your medicine.  You develop pain in your abdomen.  You have pain when you urinate.  You have bleeding after sexual intercourse.  You develop a rash.  You feel nauseous or you vomit.  You plan to become pregnant or think you may be pregnant. Summary  Trichomoniasis is an STI (sexually transmitted infection) that can affect both women and men.  This condition often has no symptoms (is asymptomatic), especially in men.  You should not have sexual intercourse until one week after you finish your medicine, or until your health care provider approves. Ask your health care provider when you may resume sexual intercourse.  Discuss your infection with your sexual partner. Make sure that your partner gets tested and treated, if necessary. This information is not intended to replace advice given to you by your health care provider. Make sure you discuss any questions you have with your health care provider. Document Released: 05/15/2001 Document Revised: 10/12/2016 Document Reviewed: 10/12/2016 Elsevier Interactive Patient Education  2017 [employer]  

## 2018-04-12 NOTE — Progress Notes (Signed)
Subjective:    Patient ID: Sheila Scott, female    DOB: 1975-12-29, 41 y.o.   MRN: [id number]  04/12/2018  STI Screening (possible exsposure )    HPI This 42 y.o. female presents for evaluation of trichomonas exposure.  Long-term partner tested positive for trichomonas.  Patient asymptomatic; denies vaginal discharge, irritation, vaginal skin lesions, pelvic pain, nausea, vomiting,dysuria.  S/p BTL.  Due for pap smear this month.  Dr. Rana Snare is gynecologist; history of abnormal pap smear.  Menses due to 2-3 days.  History of STD years ago; no sexual activity for two years prior to recent activity.   BP Readings from Last 3 Encounters:  04/12/18 140/88  04/04/18 127/75  03/26/18 130/78   Wt Readings from Last 3 Encounters:  04/12/18 179 lb (81.2 kg)  04/04/18 181 lb (82.1 kg)  03/26/18 180 lb 9.6 oz (81.9 kg)   Immunization History  Administered Date(s) Administered  . Td 02/14/2011    Review of Systems  Constitutional: Negative for chills, diaphoresis, fatigue and fever.  Gastrointestinal: Negative for abdominal pain, diarrhea, nausea, rectal pain and vomiting.  Genitourinary: Negative for difficulty urinating, dyspareunia, dysuria, flank pain, frequency, genital sores, hematuria, menstrual problem, pelvic pain, urgency, vaginal bleeding, vaginal discharge and vaginal pain.    Past Medical History:  Diagnosis Date  . Anxiety   . Depression   . GERD (gastroesophageal reflux disease)    Past Surgical History:  Procedure Laterality Date  . CYST REMOVAL HAND     wrist , left   . TUBAL LIGATION     No Known Allergies Current Outpatient Medications on File Prior to Visit  Medication Sig Dispense Refill  . Cholecalciferol (VITAMIN D3) 5000 units CAPS Take 5,000 Units by mouth daily.    Marland Kitchen dexlansoprazole (DEXILANT) 60 MG capsule Take 1 capsule (60 mg total) by mouth daily. 90 capsule 3  . ranitidine (ZANTAC) 150 MG tablet Take 1 tablet (150 mg total) by mouth 2 (two)  times daily. 60 tablet 3   Current Facility-Administered Medications on File Prior to Visit  Medication Dose Route Frequency Provider Last Rate Last Dose  . 0.9 %  sodium chloride infusion  500 mL Intravenous Once Sherrilyn Rist, MD       Social History   Socioeconomic History  . Marital status: Single    Spouse name: Not on file  . Number of children: 2  . Years of education: Not on file  . Highest education level: Not on file  Occupational History  . Occupation: Pharmacist, hospital Needs  . Financial resource strain: Not on file  . Food insecurity:    Worry: Not on file    Inability: Not on file  . Transportation needs:    Medical: Not on file    Non-medical: Not on file  Tobacco Use  . Smoking status: Current Every Day Smoker    Packs/day: 0.50    Years: 15.00    Pack years: 7.50    Types: Cigarettes  . Smokeless tobacco: Never Used  . Tobacco comment: Tobacco info given 03/19/18  Substance and Sexual Activity  . Alcohol use: Yes    Alcohol/week: 1.2 - 1.8 oz    Types: 2 - 3 Glasses of wine per week    Comment: occ  . Drug use: No  . Sexual activity: Yes  Lifestyle  . Physical activity:    Days per week: Not on file    Minutes per session: Not on file  .  Stress: Not on file  Relationships  . Social connections:    Talks on phone: Not on file    Gets together: Not on file    Attends religious service: Not on file    Active member of club or organization: Not on file    Attends meetings of clubs or organizations: Not on file    Relationship status: Not on file  . Intimate partner violence:    Fear of current or ex partner: Not on file    Emotionally abused: Not on file    Physically abused: Not on file    Forced sexual activity: Not on file  Other Topics Concern  . Not on file  Social History Narrative  . Not on file   Family History  Problem Relation Age of Onset  . Hypertension Mother   . Heart disease Mother   . Anxiety disorder Mother         panic attacks  . Diabetes Father   . Diabetes Paternal Grandmother   . Colon cancer Neg Hx   . Esophageal cancer Neg Hx   . Rectal cancer Neg Hx        Objective:    BP 140/88   Pulse 100   Temp 99 F (37.2 C) (Oral)   Resp 16   Wt 179 lb (81.2 kg)   LMP 03/21/2018   SpO2 100%   BMI 30.73 kg/m  Physical Exam  Constitutional: She is oriented to person, place, and time. She appears well-developed and well-nourished. No distress.  HENT:  Head: Normocephalic and atraumatic.  Eyes: Pupils are equal, round, and reactive to light. Conjunctivae are normal.  Neck: Normal range of motion. Neck supple.  Cardiovascular: Normal rate, regular rhythm and normal heart sounds. Exam reveals no gallop and no friction rub.  No murmur heard. Pulmonary/Chest: Effort normal and breath sounds normal. She has no wheezes. She has no rales.  Abdominal: Bowel sounds are normal. She exhibits no distension and no mass. There is no tenderness. There is no rebound and no guarding. No hernia.  Genitourinary: Uterus normal. There is no rash, tenderness, lesion or injury on the right labia. There is no rash, tenderness, lesion or injury on the left labia. Uterus is not enlarged and not tender. Cervix exhibits no motion tenderness, no discharge and no friability. Right adnexum displays no mass, no tenderness and no fullness. Left adnexum displays no mass, no tenderness and no fullness. No erythema, tenderness or bleeding in the vagina. No foreign body in the vagina. No signs of injury around the vagina. Vaginal discharge found.  Neurological: She is alert and oriented to person, place, and time.  Skin: She is not diaphoretic.  Psychiatric: She has a normal mood and affect. Her behavior is normal.  Nursing note and vitals reviewed.  No results found. Depression screen [facility] 2/9 04/12/2018 03/26/2018 03/01/2018 02/10/2018 02/10/2018  Decreased Interest 0 0 0 0 0  Down, Depressed, Hopeless 0 0 0 0 0  PHQ - 2 Score 0 0 0  0 0   Fall Risk  04/12/2018 03/26/2018 03/01/2018 02/10/2018 02/06/2017  Falls in the past year? No No No No No    Results for orders placed or performed in visit on 04/12/18  POCT Wet + KOH Prep  Result Value Ref Range   Yeast by KOH Absent Absent   Yeast by wet prep Absent Absent   WBC by wet prep Moderate (A) Few   Clue Cells Wet Prep HPF POC Few (  A) None   Trich by wet prep Present (A) Absent   Bacteria Wet Prep HPF POC Few Few   Epithelial Cells By [employer] ([facility]) Many (A) None, Few, Too numerous to count   RBC,UR,HPF,POC None None RBC/hpf       Assessment & Plan:   1. Trichomonas vaginitis   2. Exposure to trichomonas   3. Cervical cancer screening     Trichomonas vaginitis: New; treat with Metronidazole. Cervical cancer screening: pap smear obtained.  S/p BTL.  Orders Placed This Encounter  Procedures  . Trichomonas vaginalis, RNA  . POCT Wet + KOH Prep   Meds ordered this encounter  Medications  . metroNIDAZOLE (FLAGYL) 500 MG tablet    Sig: Take 4 tablets (2,000 mg total) by mouth once for 1 dose.    Dispense:  4 tablet    Refill:  0    No follow-ups on file.   Hanley Rispoli Paulita Fujita, M.D. Primary Care at [facility] [address] [place], [place]  [address] [phone] phone [phone] fax

## 2018-04-14 LAB — TRICHOMONAS VAGINALIS, PROBE AMP: Trich vag by NAA: POSITIVE — AB

## 2018-04-17 LAB — PAP IG, CT-NG NAA, HPV HIGH-RISK
Chlamydia, Nuc. Acid Amp: NEGATIVE
GONOCOCCUS BY NUCLEIC ACID AMP: NEGATIVE
HPV, high-risk: NEGATIVE
PAP Smear Comment: 0

## 2018-04-24 ENCOUNTER — Encounter: Payer: Self-pay | Admitting: Family Medicine

## 2018-07-06 ENCOUNTER — Encounter: Payer: Self-pay | Admitting: Family Medicine

## 2018-07-06 NOTE — Progress Notes (Deleted)
Subjective:    Patient ID: Sheila Scott, female    DOB: 08/30/1976, 42 y.o.   MRN: [id number]  07/06/2018  No chief complaint on file.    HPI This 42 y.o. female presents for evaluation of ***. BP Readings from Last 3 Encounters:  04/12/18 140/88  04/04/18 127/75  03/26/18 130/78   Wt Readings from Last 3 Encounters:  04/12/18 179 lb (81.2 kg)  04/04/18 181 lb (82.1 kg)  03/26/18 180 lb 9.6 oz (81.9 kg)   Immunization History  Administered Date(s) Administered  . Td 02/14/2011    Review of Systems  Past Medical History:  Diagnosis Date  . Anxiety   . Depression   . GERD (gastroesophageal reflux disease)    Past Surgical History:  Procedure Laterality Date  . CYST REMOVAL HAND     wrist , left   . TUBAL LIGATION     No Known Allergies Current Outpatient Medications on File Prior to Visit  Medication Sig Dispense Refill  . Cholecalciferol (VITAMIN D3) 5000 units CAPS Take 5,000 Units by mouth daily.    Marland Kitchen. dexlansoprazole (DEXILANT) 60 MG capsule Take 1 capsule (60 mg total) by mouth daily. 90 capsule 3  . ranitidine (ZANTAC) 150 MG tablet Take 1 tablet (150 mg total) by mouth 2 (two) times daily. 60 tablet 3   Current Facility-Administered Medications on File Prior to Visit  Medication Dose Route Frequency Provider Last Rate Last Dose  . 0.9 %  sodium chloride infusion  500 mL Intravenous Once Sherrilyn Ristanis, Henry L III, MD       Social History   Socioeconomic History  . Marital status: Single    Spouse name: Not on file  . Number of children: 2  . Years of education: Not on file  . Highest education level: Not on file  Occupational History  . Occupation: Pharmacist, hospitalinspector  Social Needs  . Financial resource strain: Not on file  . Food insecurity:    Worry: Not on file    Inability: Not on file  . Transportation needs:    Medical: Not on file    Non-medical: Not on file  Tobacco Use  . Smoking status: Current Every Day Smoker    Packs/day: 0.50    Years:  15.00    Pack years: 7.50    Types: Cigarettes  . Smokeless tobacco: Never Used  . Tobacco comment: Tobacco info given 03/19/18  Substance and Sexual Activity  . Alcohol use: Yes    Alcohol/week: 1.2 - 1.8 oz    Types: 2 - 3 Glasses of wine per week    Comment: occ  . Drug use: No  . Sexual activity: Yes  Lifestyle  . Physical activity:    Days per week: Not on file    Minutes per session: Not on file  . Stress: Not on file  Relationships  . Social connections:    Talks on phone: Not on file    Gets together: Not on file    Attends religious service: Not on file    Active member of club or organization: Not on file    Attends meetings of clubs or organizations: Not on file    Relationship status: Not on file  . Intimate partner violence:    Fear of current or ex partner: Not on file    Emotionally abused: Not on file    Physically abused: Not on file    Forced sexual activity: Not on file  Other Topics Concern  .  Not on file  Social History Narrative  . Not on file   Family History  Problem Relation Age of Onset  . Hypertension Mother   . Heart disease Mother   . Anxiety disorder Mother        panic attacks  . Diabetes Father   . Diabetes Paternal Grandmother   . Colon cancer Neg Hx   . Esophageal cancer Neg Hx   . Rectal cancer Neg Hx        Objective:    There were no vitals taken for this visit. Physical Exam No results found. Depression screen [facility] 2/9 04/12/2018 03/26/2018 03/01/2018 02/10/2018 02/10/2018  Decreased Interest 0 0 0 0 0  Down, Depressed, Hopeless 0 0 0 0 0  PHQ - 2 Score 0 0 0 0 0   Fall Risk  04/12/2018 03/26/2018 03/01/2018 02/10/2018 02/06/2017  Falls in the past year? No No No No No        Assessment & Plan:  No diagnosis found.  ***  No orders of the defined types were placed in this encounter.  No orders of the defined types were placed in this encounter.   No follow-ups on file.   Kristi Paulita Fujita, M.D. Primary Care at  [facility] previously [facility] [address] [place], [place]  [address] [phone] phone [phone] fax

## 2019-03-10 IMAGING — CR DG CHEST 2V
2 series · 2 of 2 positions shown · non-contrast
Comparison: None.

CLINICAL DATA: Patient fell during skating and complains of central
chest pain.

EXAM:
CHEST  2 VIEW

[w chest pa]
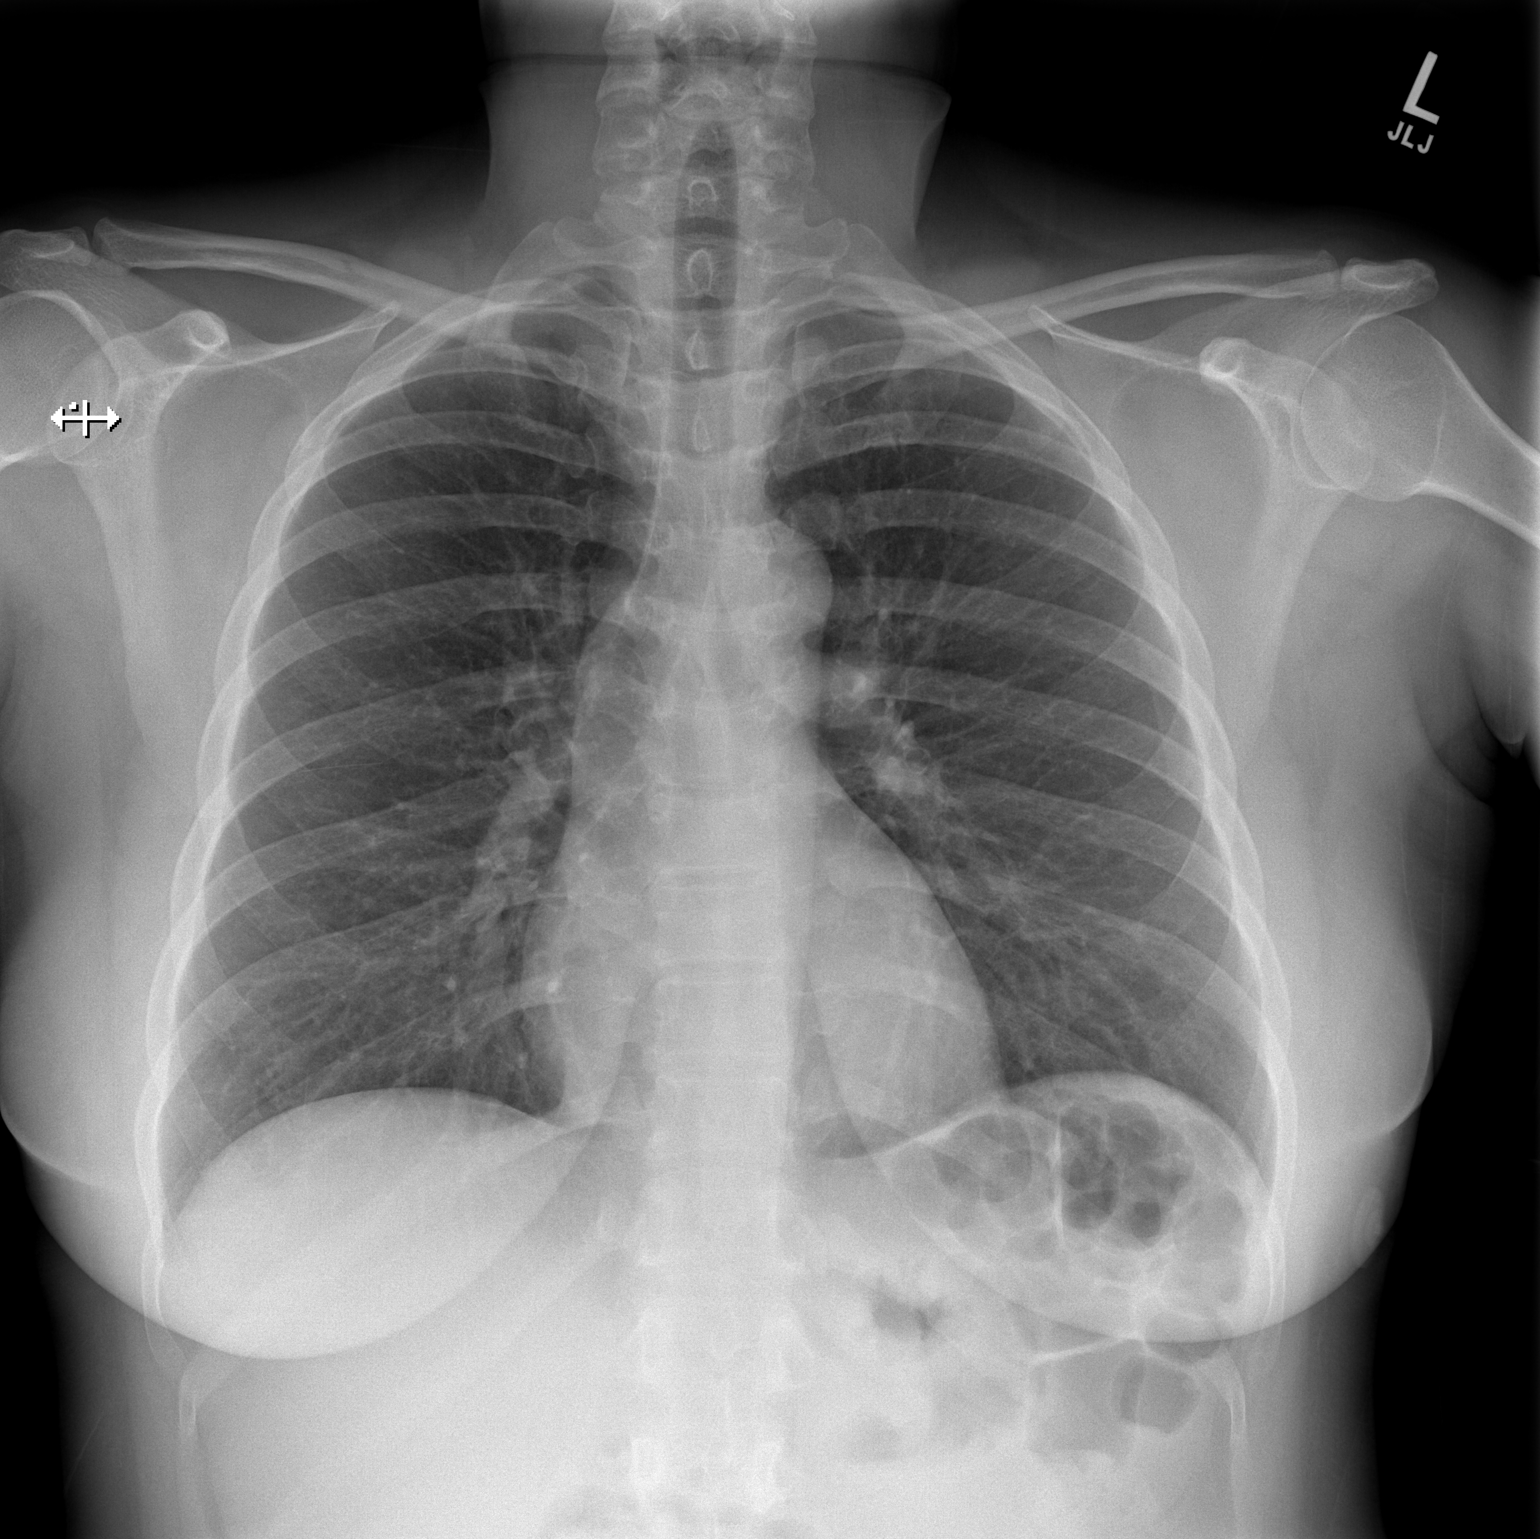

[w chest lat]
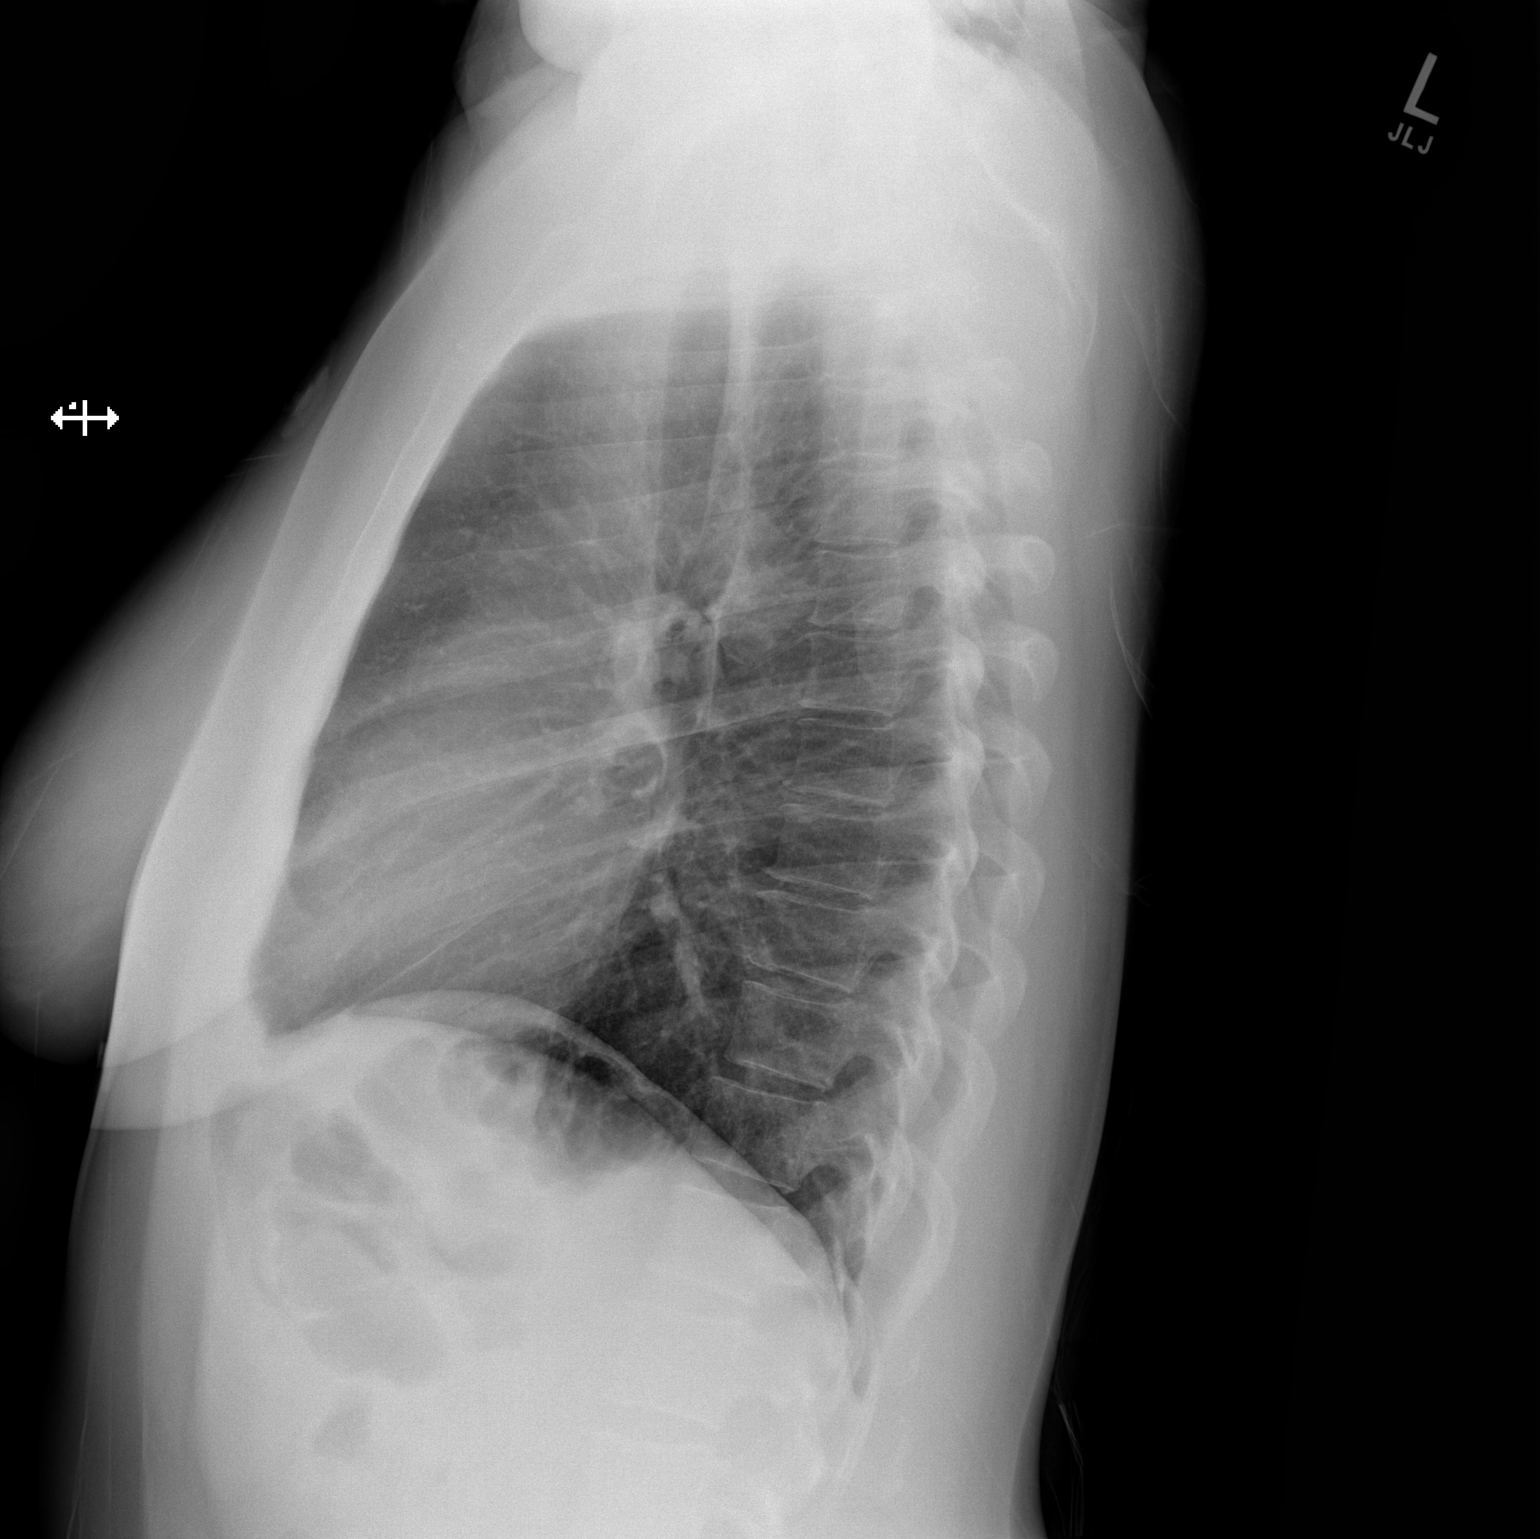

[2 of 2 positions shown; findings below may reference images not displayed]

FINDINGS: The heart size and mediastinal contours are within normal limits.
Both lungs are clear. The visualized skeletal structures are
unremarkable.
IMPRESSION: No active cardiopulmonary disease.

## 2019-06-17 ENCOUNTER — Other Ambulatory Visit: Payer: Self-pay | Admitting: Pediatrics

## 2019-06-17 ENCOUNTER — Encounter: Payer: Self-pay | Admitting: Family Medicine

## 2019-06-17 ENCOUNTER — Other Ambulatory Visit: Payer: Self-pay | Admitting: Family Medicine

## 2019-06-17 ENCOUNTER — Telehealth: Payer: Self-pay | Admitting: Family Medicine

## 2019-06-17 ENCOUNTER — Telehealth (INDEPENDENT_AMBULATORY_CARE_PROVIDER_SITE_OTHER): Payer: BC Managed Care – PPO | Admitting: Family Medicine

## 2019-06-17 DIAGNOSIS — Z20828 Contact with and (suspected) exposure to other viral communicable diseases: Secondary | ICD-10-CM | POA: Diagnosis not present

## 2019-06-17 DIAGNOSIS — Z20822 Contact with and (suspected) exposure to covid-19: Secondary | ICD-10-CM | POA: Insufficient documentation

## 2019-06-17 DIAGNOSIS — R6889 Other general symptoms and signs: Secondary | ICD-10-CM | POA: Diagnosis not present

## 2019-06-17 NOTE — Telephone Encounter (Signed)
S/t, cough

## 2019-06-17 NOTE — Progress Notes (Signed)
Telemedicine Encounter- SOAP NOTE Established Patient  I discussed the limitations, risks, security and privacy concerns of performing an evaluation and management service by telephone and the availability of in person appointments. I also discussed with the patient that there may be a patient responsible charge related to this service. The patient expressed understanding and agreed to proceed.  This telephone encounter was conducted with the patient's (or  verbal consent via audio telecommunications: Patient was instructed to have this encounter in a suitably private space; and to only have persons present to whom they give permission to participate. In addition, patient identity was confirmed by use of name plus two identifiers (DOB and address).  I spent a total of 18min talking with the patient or their proxy. s/t , cough, increase fatigue Exposure on the job -Sunday 7am -3pm Notified by job of exposure on Monday  Sheila Scott is a 43 y.o. female established patient. Telephone visit today for concern for COVID-19 testing  HPI  Pt with s/t and voice changes on Monday Tuesday pt started using honey water-hoarseness continued Cough noted with mucous on Tuesday Diarrhea onset this morning Headache today Lives with son-he has no symptoms  Patient Active Problem List   Diagnosis Date Noted  . OTH ABNORMAL PAPANICOLAOU SMEAR CERVIX&CERV HPV 02/16/2011  . HYPERLIPIDEMIA 02/14/2011  . TOBACCO USE 02/14/2011  . HYPERGLYCEMIA, FASTING 02/14/2011    Past Medical History:  Diagnosis Date  . Anxiety   . Depression   . GERD (gastroesophageal reflux disease)     Current Outpatient Medications  Medication Sig Dispense Refill  . Cholecalciferol (VITAMIN D3) 5000 units CAPS Take 5,000 Units by mouth daily.    Marland Kitchen dexlansoprazole (DEXILANT) 60 MG capsule Take 1 capsule (60 mg total) by mouth daily. 90 capsule 3  . ranitidine (ZANTAC) 150 MG tablet Take 1 tablet  (150 mg total) by mouth 2 (two) times daily. 60 tablet 3   Current Facility-Administered Medications  Medication Dose Route Frequency Provider Last Rate Last Dose  . 0.9 %  sodium chloride infusion  500 mL Intravenous Once Doran Stabler, MD        Not on File  Social History   Socioeconomic History  . Marital status: Single    Spouse name: Not on file  . Number of children: 2  . Years of education: Not on file  . Highest education level: Not on file  Occupational History  . Occupation: Programmer, systems Needs  . Financial resource strain: Not on file  . Food insecurity    Worry: Not on file    Inability: Not on file  . Transportation needs    Medical: Not on file    Non-medical: Not on file  Tobacco Use  . Smoking status: Current Every Day Smoker    Packs/day: 0.50    Years: 15.00    Pack years: 7.50    Types: Cigarettes  . Smokeless tobacco: Never Used  . Tobacco comment: Tobacco info given 03/19/18  Substance and Sexual Activity  . Alcohol use: Yes    Alcohol/week: 2.0 - 3.0 standard drinks    Types: 2 - 3 Glasses of wine per week    Comment: occ  . Drug use: No  . Sexual activity: Yes  Lifestyle  . Physical activity    Days per week: Not on file    Minutes per session: Not on file  . Stress: Not on file  Relationships  . Social  connections    Talks on phone: Not on file    Gets together: Not on file    Attends religious service: Not on file    Active member of club or organization: Not on file    Attends meetings of clubs or organizations: Not on file    Relationship status: Not on file  . Intimate partner violence    Fear of current or ex partner: Not on file    Emotionally abused: Not on file    Physically abused: Not on file    Forced sexual activity: Not on file  Other Topics Concern  . Not on file  Social History Narrative  . Not on file    Review of Systems  Constitutional: Positive for malaise/fatigue. Negative for fever.  HENT:  Positive for congestion and sore throat.   Respiratory: Positive for cough and sputum production. Negative for shortness of breath.   Gastrointestinal: Negative for nausea.  Neurological: Negative for dizziness and headaches.    Objective   Vitals as reported by the patient: virtual  1. Exposure to Covid-19 Virus Work exposure on Sunday, + symptoms, work requested pt be tested prior to rtw I discussed the assessment and treatment plan with the patient. The patient was provided an opportunity to ask questions and all were answered. The patient agreed with the plan and demonstrated an understanding of the instructions.   The patient was advised to call back or seek an in-person evaluation if the symptoms worsen or if the condition fails to improve as anticipated.  I provided  10 minutes of non-face-to-face time during this encounter.   [name] , MD  [facility] at [facility] 06-17-19

## 2019-06-17 NOTE — Telephone Encounter (Signed)
S/t, cough, diarrhea

## 2019-06-17 NOTE — Telephone Encounter (Signed)
Spoke with patient, advised her an appointment is not needed at test site.  She plans to go to [facility] today for testing before 3:30 pm.  Testing protocol reviewed.  Order placed.

## 2019-06-17 NOTE — Addendum Note (Signed)
Addended by: Denyce Robert on: 06/17/2019 01:19 PM   Modules accepted: Orders

## 2019-06-21 LAB — NOVEL CORONAVIRUS, NAA: SARS-CoV-2, NAA: NOT DETECTED

## 2019-06-25 NOTE — Telephone Encounter (Signed)
Error

## 2019-09-01 ENCOUNTER — Emergency Department (HOSPITAL_COMMUNITY)
Admission: EM | Admit: 2019-09-01 | Discharge: 2019-09-01 | Disposition: A | Discharge status: Home / Self Care | Payer: BC Managed Care – PPO | Attending: Emergency Medicine | Admitting: Emergency Medicine

## 2019-09-01 ENCOUNTER — Other Ambulatory Visit: Payer: Self-pay

## 2019-09-01 ENCOUNTER — Emergency Department (HOSPITAL_COMMUNITY): Payer: BC Managed Care – PPO

## 2019-09-01 ENCOUNTER — Encounter (HOSPITAL_COMMUNITY): Payer: Self-pay | Admitting: Emergency Medicine

## 2019-09-01 DIAGNOSIS — S46811A Strain of other muscles, fascia and tendons at shoulder and upper arm level, right arm, initial encounter: Secondary | ICD-10-CM | POA: Diagnosis not present

## 2019-09-01 DIAGNOSIS — M79642 Pain in left hand: Secondary | ICD-10-CM | POA: Diagnosis not present

## 2019-09-01 DIAGNOSIS — Y92410 Unspecified street and highway as the place of occurrence of the external cause: Secondary | ICD-10-CM | POA: Diagnosis not present

## 2019-09-01 DIAGNOSIS — R519 Headache, unspecified: Secondary | ICD-10-CM

## 2019-09-01 DIAGNOSIS — F17210 Nicotine dependence, cigarettes, uncomplicated: Secondary | ICD-10-CM | POA: Diagnosis not present

## 2019-09-01 DIAGNOSIS — Y9389 Activity, other specified: Secondary | ICD-10-CM | POA: Insufficient documentation

## 2019-09-01 DIAGNOSIS — Y999 Unspecified external cause status: Secondary | ICD-10-CM | POA: Insufficient documentation

## 2019-09-01 DIAGNOSIS — S0512XA Contusion of eyeball and orbital tissues, left eye, initial encounter: Secondary | ICD-10-CM | POA: Insufficient documentation

## 2019-09-01 DIAGNOSIS — S199XXA Unspecified injury of neck, initial encounter: Secondary | ICD-10-CM | POA: Diagnosis present

## 2019-09-01 DIAGNOSIS — R51 Headache: Secondary | ICD-10-CM | POA: Insufficient documentation

## 2019-09-01 DIAGNOSIS — M545 Low back pain, unspecified: Secondary | ICD-10-CM

## 2019-09-01 MED ORDER — OXYCODONE HCL 5 MG PO TABS
5.0000 mg | ORAL_TABLET | Freq: Once | ORAL | Status: AC
  Administered 2019-09-01: 09:00:00 5 mg via ORAL
  Filled 2019-09-01: qty 1

## 2019-09-01 MED ORDER — KETOROLAC TROMETHAMINE 60 MG/2ML IM SOLN
15.0000 mg | Freq: Once | INTRAMUSCULAR | Status: AC
  Administered 2019-09-01: 15 mg via INTRAMUSCULAR
  Filled 2019-09-01: qty 2

## 2019-09-01 MED ORDER — ACETAMINOPHEN 500 MG PO TABS
1000.0000 mg | ORAL_TABLET | Freq: Once | ORAL | Status: AC
  Administered 2019-09-01: 1000 mg via ORAL
  Filled 2019-09-01: qty 2

## 2019-09-01 NOTE — Discharge Instructions (Signed)
You will hurt worse tomorrow.  This is normal.  Please return for shortness of breath abdominal pain if you start urinating blood as you become confused or have uncontrolled vomiting.  If you are still having pain in your hand or back or neck in a week then it would be reasonable for your family doctor to obtain another imaging study.  The imaging study of your hand today is normal.  Take 4 over the counter ibuprofen tablets 3 times a day or 2 over-the-counter naproxen tablets twice a day for pain. Also take tylenol 1000mg (2 extra strength) four times a day.
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
## 2019-09-01 NOTE — ED Notes (Signed)
Patient transported to X-ray 
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
## 2019-09-16 ENCOUNTER — Ambulatory Visit: Payer: Self-pay | Admitting: Family Medicine

## 2019-12-15 ENCOUNTER — Ambulatory Visit (INDEPENDENT_AMBULATORY_CARE_PROVIDER_SITE_OTHER): Payer: BC Managed Care – PPO

## 2019-12-15 ENCOUNTER — Ambulatory Visit: Payer: BC Managed Care – PPO | Admitting: Registered Nurse

## 2019-12-15 ENCOUNTER — Encounter: Payer: Self-pay | Admitting: Registered Nurse

## 2019-12-15 ENCOUNTER — Other Ambulatory Visit: Payer: Self-pay

## 2019-12-15 VITALS — BP 137/97 | HR 116 | Temp 98.2°F | Resp 16 | Ht 64.0 in | Wt 180.0 lb

## 2019-12-15 DIAGNOSIS — M25512 Pain in left shoulder: Secondary | ICD-10-CM

## 2019-12-15 DIAGNOSIS — M62838 Other muscle spasm: Secondary | ICD-10-CM | POA: Diagnosis not present

## 2019-12-15 DIAGNOSIS — M47812 Spondylosis without myelopathy or radiculopathy, cervical region: Secondary | ICD-10-CM | POA: Diagnosis not present

## 2019-12-15 DIAGNOSIS — M542 Cervicalgia: Secondary | ICD-10-CM | POA: Diagnosis not present

## 2019-12-15 MED ORDER — MELOXICAM 7.5 MG PO TABS: 7.5000 mg | ORAL_TABLET | Freq: Every day | ORAL | 0 refills | Status: DC

## 2019-12-15 MED ORDER — CYCLOBENZAPRINE HCL 5 MG PO TABS: 5.0000 mg | ORAL_TABLET | Freq: Three times a day (TID) | ORAL | 1 refills | Status: DC | PRN

## 2019-12-15 NOTE — Patient Instructions (Signed)
° ° ° °  If you have lab work done today you will be contacted with your lab results within the next 2 weeks.  If you have not heard from us then please contact us. The fastest way to get your results is to register for My Chart. ° ° °IF you received an x-ray today, you will receive an invoice from [facility]. Please contact [facility] at [phone] with questions or concerns regarding your invoice.  ° °IF you received labwork today, you will receive an invoice from [facility]. Please contact [facility] at [phone] with questions or concerns regarding your invoice.  ° °Our billing staff will not be able to assist you with questions regarding bills from these companies. ° °You will be contacted with the lab results as soon as they are available. The fastest way to get your results is to activate your My Chart account. Instructions are located on the last page of this paperwork. If you have not heard from us regarding the results in 2 weeks, please contact this office. °  ° ° ° °

## 2019-12-18 NOTE — Progress Notes (Signed)
Acute Office Visit  Subjective:    Patient ID: Sheila Scott, female    DOB: October 18, 1976, 44 y.o.   MRN: [id number]  Chief Complaint  Patient presents with  . Back Pain    x 1 week  . Arm Pain    and LEFT shoulder pain    HPI Patient is in today for left shoulder pain  Ongoing x 1 week. Extremely limited ROM. Pain radiates down from neck. No acute injury to her knowledge. This has happened before No numbness, weakness, tingling in LUE Notes that she has a history of arthritic changes in her cervical spine.   Past Medical History:  Diagnosis Date  . Anxiety   . Depression   . GERD (gastroesophageal reflux disease)     Past Surgical History:  Procedure Laterality Date  . CYST REMOVAL HAND     wrist , left   . TUBAL LIGATION      Family History  Problem Relation Age of Onset  . Hypertension Mother   . Heart disease Mother   . Anxiety disorder Mother        panic attacks  . Diabetes Father   . Diabetes Paternal Grandmother   . Colon cancer Neg Hx   . Esophageal cancer Neg Hx   . Rectal cancer Neg Hx     Social History   Socioeconomic History  . Marital status: Single    Spouse name: Not on file  . Number of children: 2  . Years of education: Not on file  . Highest education level: Not on file  Occupational History  . Occupation: Midwife  Tobacco Use  . Smoking status: Current Every Day Smoker    Packs/day: 0.50    Years: 15.00    Pack years: 7.50    Types: Cigarettes  . Smokeless tobacco: Never Used  . Tobacco comment: Tobacco info given 03/19/18  Substance and Sexual Activity  . Alcohol use: Yes    Alcohol/week: 2.0 - 3.0 standard drinks    Types: 2 - 3 Glasses of wine per week    Comment: occ  . Drug use: No  . Sexual activity: Yes  Other Topics Concern  . Not on file  Social History Narrative  . Not on file   Social Determinants of Health   Financial Resource Strain:   . Difficulty of Paying Living Expenses: Not on file  Food  Insecurity:   . Worried About Programme researcher, broadcasting/film/video in the Last Year: Not on file  . Ran Out of Food in the Last Year: Not on file  Transportation Needs:   . Lack of Transportation (Medical): Not on file  . Lack of Transportation (Non-Medical): Not on file  Physical Activity:   . Days of Exercise per Week: Not on file  . Minutes of Exercise per Session: Not on file  Stress:   . Feeling of Stress : Not on file  Social Connections:   . Frequency of Communication with Friends and Family: Not on file  . Frequency of Social Gatherings with Friends and Family: Not on file  . Attends Religious Services: Not on file  . Active Member of Clubs or Organizations: Not on file  . Attends Banker Meetings: Not on file  . Marital Status: Not on file  Intimate Partner Violence:   . Fear of Current or Ex-Partner: Not on file  . Emotionally Abused: Not on file  . Physically Abused: Not on file  . Sexually Abused:  Not on file    Outpatient Medications Prior to Visit  Medication Sig Dispense Refill  . dexlansoprazole (DEXILANT) 60 MG capsule Take 1 capsule (60 mg total) by mouth daily. (Patient not taking: Reported on 12/15/2019) 90 capsule 3  . ranitidine (ZANTAC) 150 MG tablet Take 1 tablet (150 mg total) by mouth 2 (two) times daily. (Patient not taking: Reported on 12/15/2019) 60 tablet 3   Facility-Administered Medications Prior to Visit  Medication Dose Route Frequency Provider Last Rate Last Admin  . 0.9 %  sodium chloride infusion  500 mL Intravenous Once Sherrilyn Rist, MD        No Known Allergies  Review of Systems Per hpi      Objective:    Physical Exam Vitals reviewed.  Constitutional:      General: She is not in acute distress.    Appearance: Normal appearance. She is normal weight. She is not ill-appearing, toxic-appearing or diaphoretic.  Cardiovascular:     Rate and Rhythm: Normal rate and regular rhythm.  Pulmonary:     Effort: Pulmonary effort is normal.  No respiratory distress.  Musculoskeletal:        General: Tenderness (anterior joint line L shoulder) and signs of injury present. No swelling or deformity.     Right lower leg: No edema.     Left lower leg: No edema.     Comments: L shoulder active ROM virtually none - passive limited d/t pain   Skin:    General: Skin is warm and dry.     Capillary Refill: Capillary refill takes less than 2 seconds.     Coloration: Skin is not jaundiced or pale.     Findings: No bruising, erythema, lesion or rash.  Neurological:     General: No focal deficit present.     Mental Status: She is alert and oriented to person, place, and time. Mental status is at baseline.     Cranial Nerves: No cranial nerve deficit.     Sensory: No sensory deficit.     Motor: Weakness (LUE) present.  Psychiatric:        Mood and Affect: Mood normal.        Behavior: Behavior normal.        Thought Content: Thought content normal.        Judgment: Judgment normal.     BP (!) 137/97   Pulse (!) 116   Temp 98.2 F (36.8 C) (Temporal)   Resp 16   Ht 5\' 4"  (1.626 m) Comment: per pt  Wt 180 lb (81.6 kg)   LMP 12/14/2019   SpO2 98%   BMI 30.90 kg/m  Wt Readings from Last 3 Encounters:  12/15/19 180 lb (81.6 kg)  09/01/19 180 lb (81.6 kg)  04/12/18 179 lb (81.2 kg)    Health Maintenance Due  Topic Date Due  . HIV Screening  12/04/1991    There are no preventive care reminders to display for this patient.   Lab Results  Component Value Date   TSH 2.25 02/14/2011   Lab Results  Component Value Date   WBC 8.8 01/17/2018   HGB 13.7 01/17/2018   HCT 41.6 01/17/2018   MCV 91.4 01/17/2018   PLT 245 01/17/2018   Lab Results  Component Value Date   NA 136 01/17/2018   K 4.2 01/17/2018   CO2 23 01/17/2018   GLUCOSE 98 01/17/2018   BUN 6 01/17/2018   CREATININE 0.77 01/17/2018   BILITOT 0.5 08/06/2012  ALKPHOS 46 08/06/2012   AST 19 08/06/2012   ALT 21 08/06/2012   PROT 7.5 08/06/2012    ALBUMIN 4.6 08/06/2012   CALCIUM 9.3 01/17/2018   ANIONGAP 11 01/17/2018   GFR 123.04 02/14/2011   Lab Results  Component Value Date   CHOL 216 (H) 02/14/2011   Lab Results  Component Value Date   HDL 37.70 (L) 02/14/2011   No results found for: [facility] Lab Results  Component Value Date   TRIG 110.0 02/14/2011   Lab Results  Component Value Date   CHOLHDL 6 02/14/2011   Lab Results  Component Value Date   HGBA1C 5.0 02/14/2011       Assessment & Plan:   Problem List Items Addressed This Visit    None    Visit Diagnoses    Neck pain    -  Primary   Relevant Orders   DG Cervical Spine Complete (Completed)   Acute pain of left shoulder       Relevant Orders   DG Shoulder Left (Completed)   Cervical paraspinal muscle spasm       Relevant Medications   meloxicam (MOBIC) 7.5 MG tablet   cyclobenzaprine (FLEXERIL) 5 MG tablet       Meds ordered this encounter  Medications  . meloxicam (MOBIC) 7.5 MG tablet    Sig: Take 1 tablet (7.5 mg total) by mouth daily.    Dispense:  30 tablet    Refill:  0    Order Specific Question:   Supervising Provider    Answer:   Delia Chimes A [id number]  . cyclobenzaprine (FLEXERIL) 5 MG tablet    Sig: Take 1 tablet (5 mg total) by mouth 3 (three) times daily as needed for muscle spasms.    Dispense:  30 tablet    Refill:  1    Order Specific Question:   Supervising Provider    Answer:   Forrest Moron [id number]   PLAN  Normal shoulder imaging  Reverse of curvature in c spine suggests muscle spasm  Meloxicam, flexeril, rest ice compression as tolerated  Return if symptoms worsen or fail to improve  Patient encouraged to call clinic with any questions, comments, or concerns.   Maximiano Coss, NP

## 2020-01-07 ENCOUNTER — Other Ambulatory Visit: Payer: Self-pay | Admitting: Registered Nurse

## 2020-01-07 DIAGNOSIS — M62838 Other muscle spasm: Secondary | ICD-10-CM

## 2020-10-27 IMAGING — CR DG HAND COMPLETE 3+V*L*
3 series · 3 of 3 positions shown · non-contrast
Comparison: None

CLINICAL DATA: No acute osseous abnormalities.

EXAM:
LEFT HAND - COMPLETE 3+ VIEW

[x hand pa left]
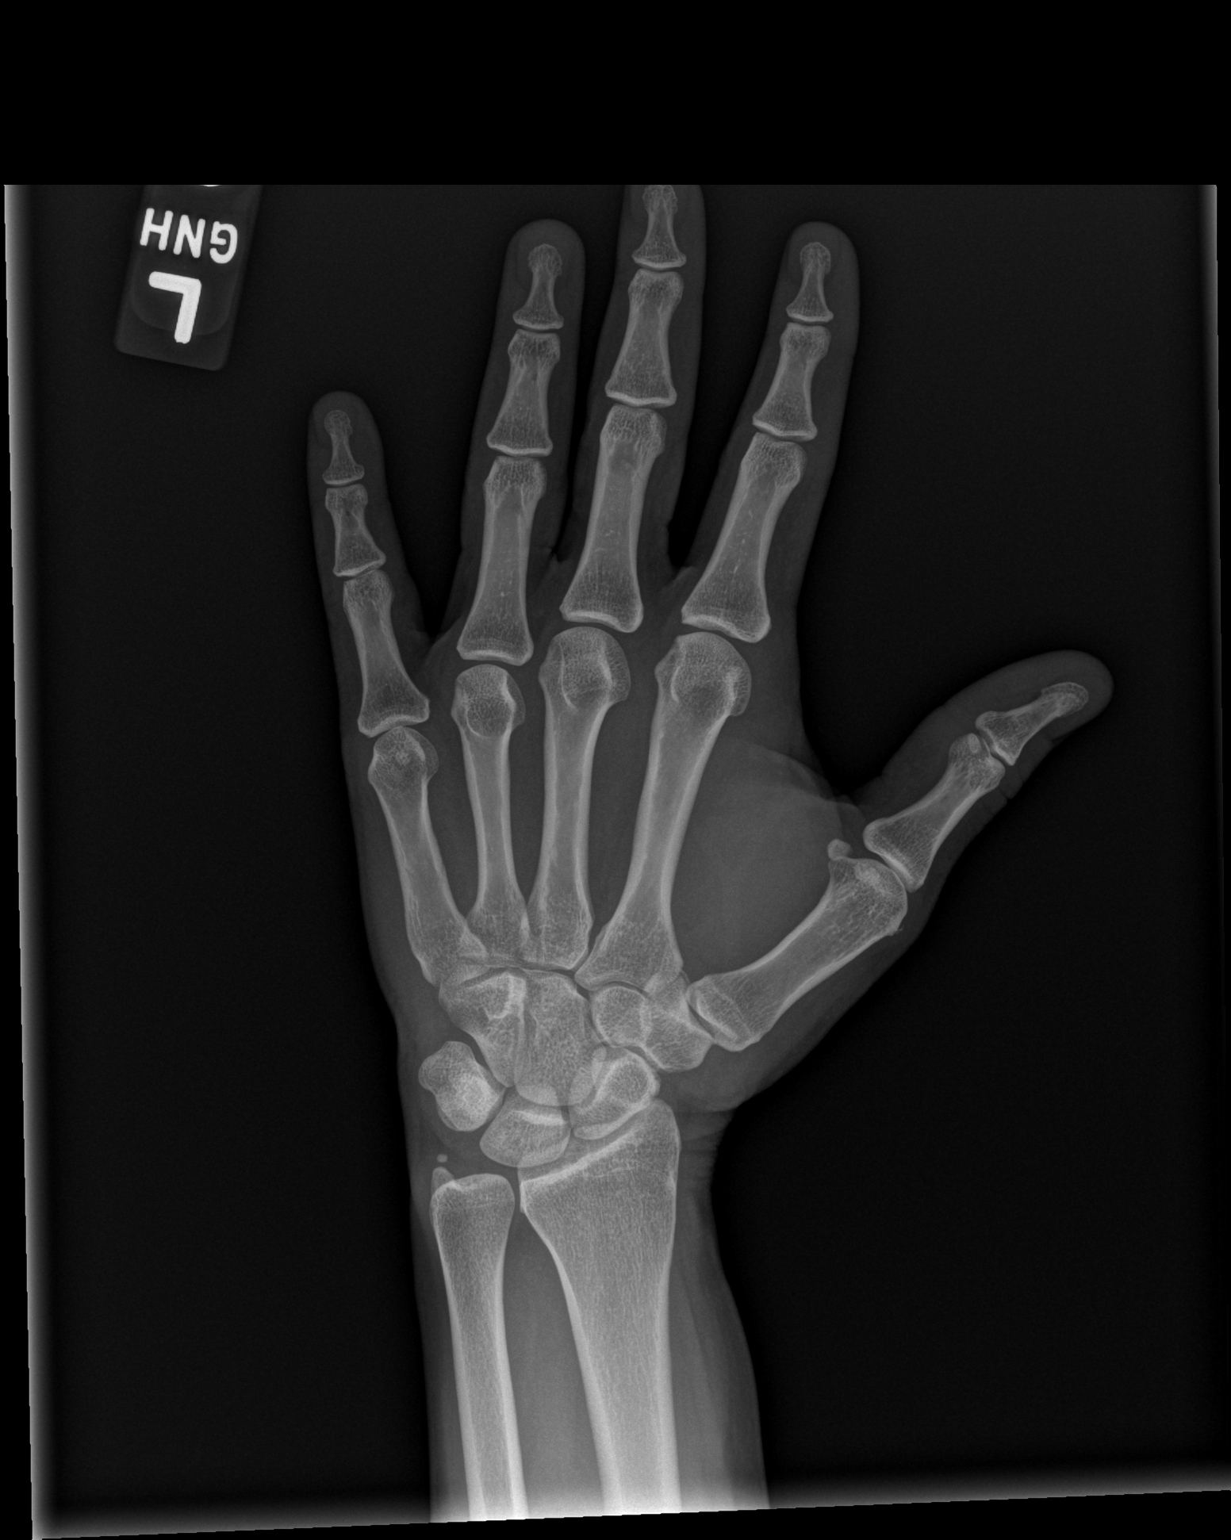

[x hand obl left]
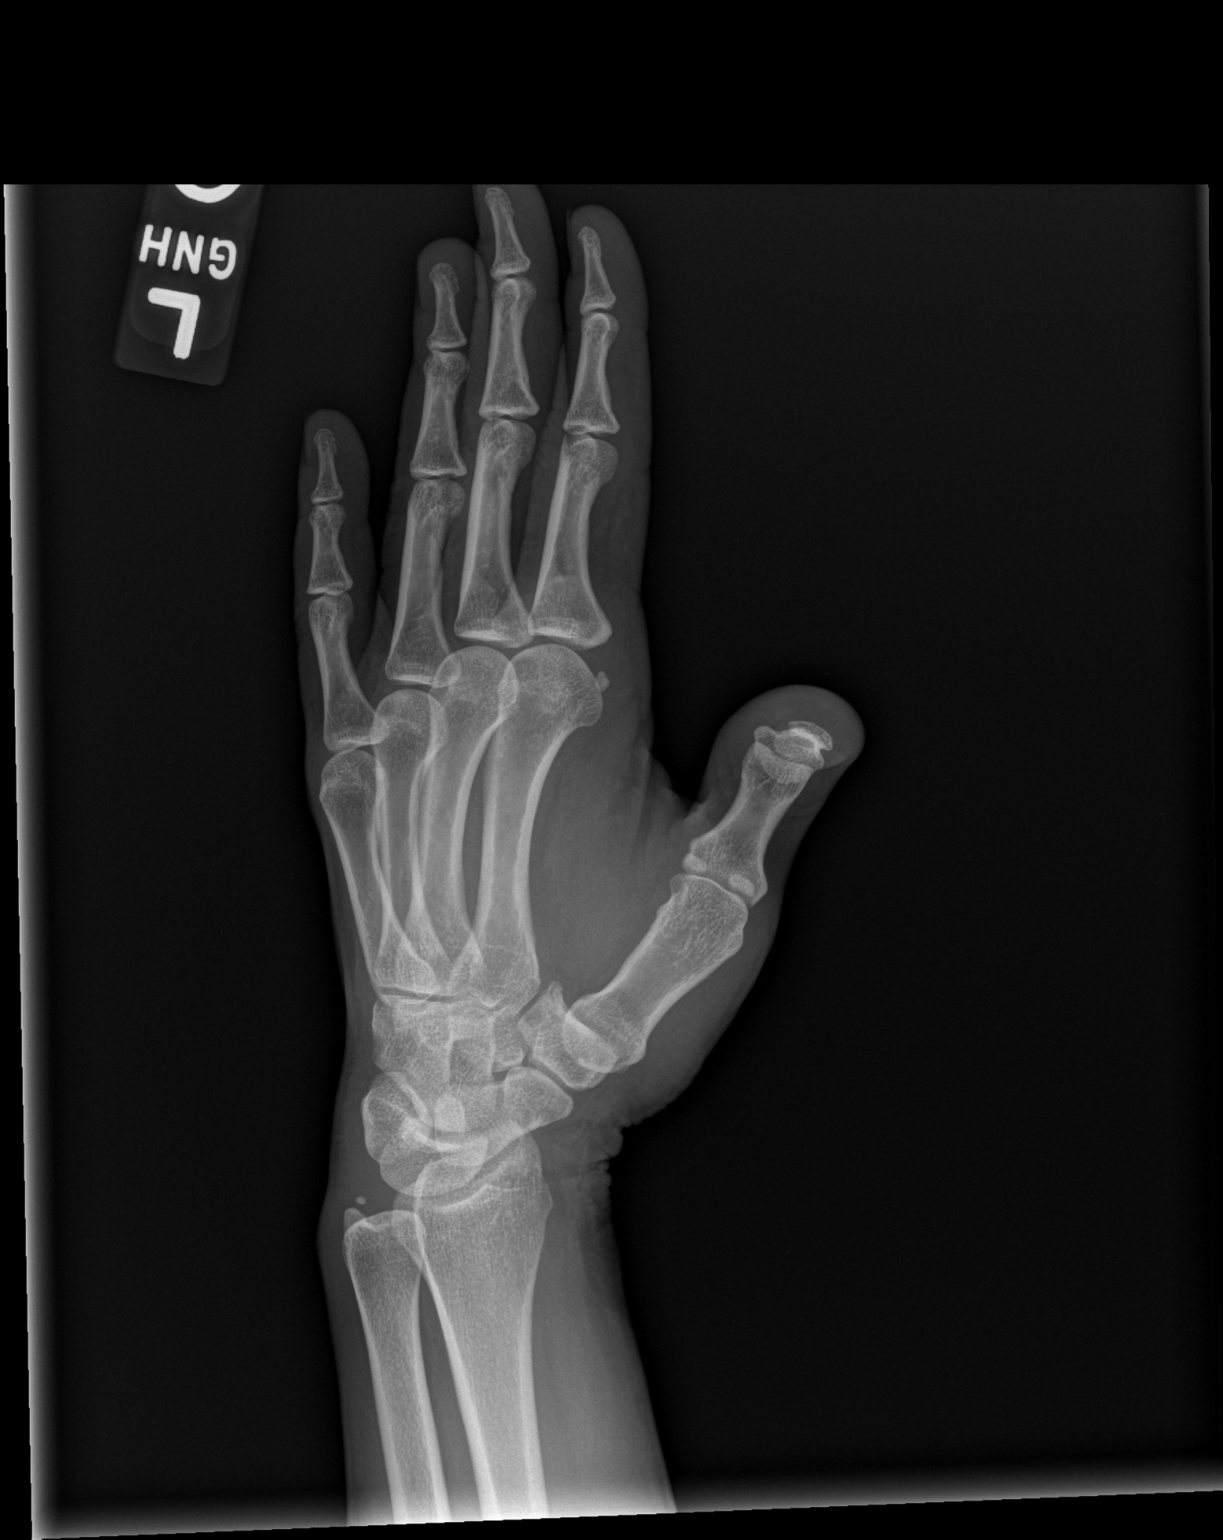

[x hand lat left]
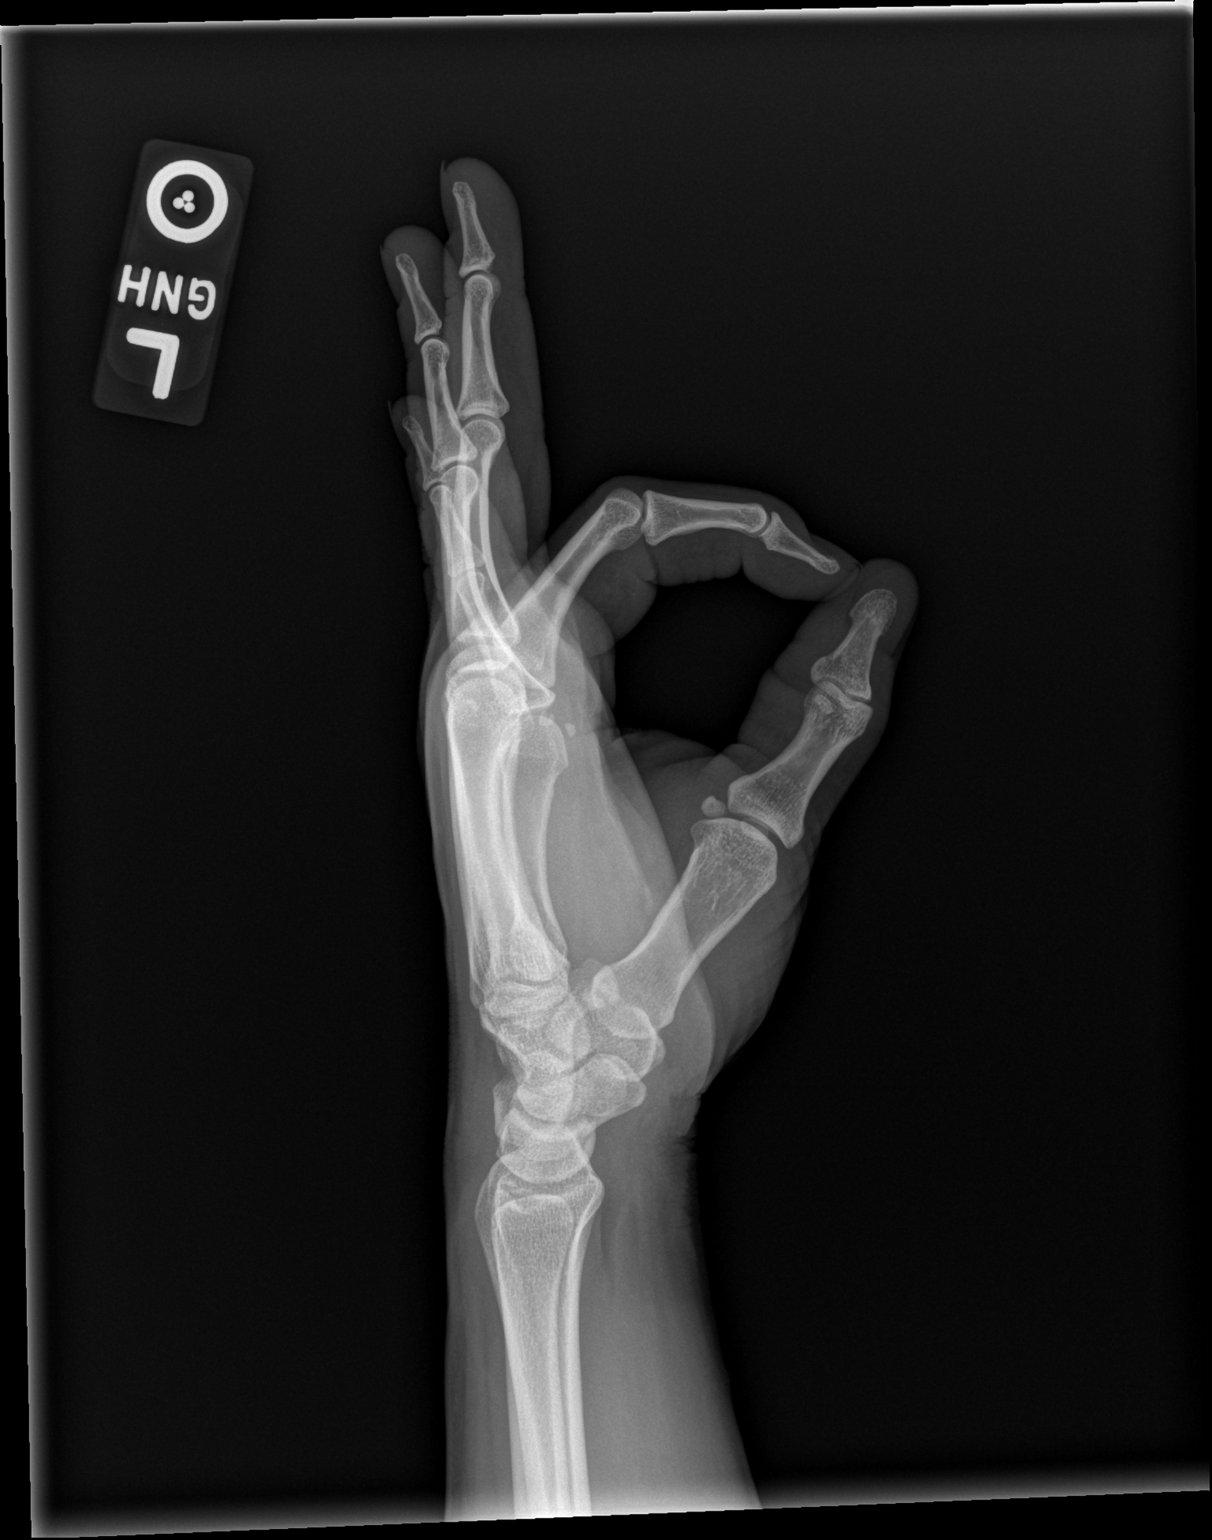

[3 of 3 positions shown; findings below may reference images not displayed]

FINDINGS: Osseous mineralization normal.

Joint spaces preserved.

Tiny non fused ossicle at tip of ulnar styloid, appears
corticated/old.

No acute fracture, dislocation, or bone destruction.
IMPRESSION: Normal exam.

## 2021-03-06 ENCOUNTER — Ambulatory Visit
Admission: EM | Admit: 2021-03-06 | Discharge: 2021-03-06 | Disposition: A | Discharge status: Home / Self Care | Payer: BC Managed Care – PPO | Attending: Emergency Medicine | Admitting: Emergency Medicine

## 2021-03-06 ENCOUNTER — Other Ambulatory Visit: Payer: Self-pay

## 2021-03-06 ENCOUNTER — Encounter: Payer: Self-pay | Admitting: Emergency Medicine

## 2021-03-06 DIAGNOSIS — M62838 Other muscle spasm: Secondary | ICD-10-CM | POA: Diagnosis not present

## 2021-03-06 MED ORDER — TIZANIDINE HCL 4 MG PO TABS: 4.0000 mg | ORAL_TABLET | Freq: Three times a day (TID) | ORAL | 0 refills | Status: AC | PRN

## 2021-03-06 MED ORDER — IBUPROFEN 600 MG PO TABS: 600.0000 mg | ORAL_TABLET | Freq: Four times a day (QID) | ORAL | 0 refills | Status: AC | PRN

## 2021-03-06 MED ORDER — METHYLPREDNISOLONE 4 MG PO TBPK: ORAL_TABLET | Freq: Every day | ORAL | 0 refills | Status: AC

## 2021-03-06 NOTE — Discharge Instructions (Addendum)
Take 600 mg of ibuprofen combined with 1000 mg of Tylenol together 3-4 times a day.  Zanaflex for muscle spasms.  Warm compresses, Epson salt baths/soaks.  Medrol Dosepak will help with nerve pain and inflammation.  Follow-up with [facility] in a week if you are not getting any better.
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
## 2021-03-06 NOTE — ED Triage Notes (Signed)
Pt states that she has some left shoulder pain. Pt states that she starts to feel the pain on the left side of her neck then it radiates down to her shoulder and makes it hard to move her shoulder and arm. Pt states that she also feels numbness in her pointer finger

## 2024-02-25 DIAGNOSIS — H524 Presbyopia: Secondary | ICD-10-CM | POA: Diagnosis not present

## 2024-07-25 ENCOUNTER — Ambulatory Visit

## 2024-10-02 ENCOUNTER — Ambulatory Visit

## 2024-10-02 ENCOUNTER — Telehealth: Admitting: Family Medicine

## 2024-10-02 DIAGNOSIS — N760 Acute vaginitis: Secondary | ICD-10-CM

## 2024-10-02 DIAGNOSIS — B9689 Other specified bacterial agents as the cause of diseases classified elsewhere: Secondary | ICD-10-CM

## 2024-10-02 MED ORDER — METRONIDAZOLE 0.75 % VA GEL: 1.0000 | Freq: Every day | VAGINAL | 0 refills | Status: AC

## 2024-10-02 NOTE — Patient Instructions (Signed)
  Farrel LILLETTE Hsu, thank you for joining Chiquita CHRISTELLA Barefoot, NP for today's virtual visit.  While this provider is not your primary care provider (PCP), if your PCP is located in our provider database this encounter information will be shared with them immediately following your visit.   A [facility] MyChart account gives you access to today's visit and all your visits, tests, and labs performed at [facility]  click here if you don't have a [facility] MyChart account or go to [email or web address]  Consent: (Patient) Sheila Scott provided verbal consent for this virtual visit at the beginning of the encounter.  Current Medications:  Current Outpatient Medications:    metroNIDAZOLE  (METROGEL ) 0.75 % vaginal gel, Place 1 Applicatorful vaginally at bedtime for 5 days., Disp: 50 g, Rfl: 0   dexlansoprazole  (DEXILANT ) 60 MG capsule, Take 1 capsule (60 mg total) by mouth daily. (Patient not taking: No sig reported), Disp: 90 capsule, Rfl: 3   ibuprofen  (ADVIL ) 600 MG tablet, Take 1 tablet (600 mg total) by mouth every 6 (six) hours as needed., Disp: 30 tablet, Rfl: 0   methylPREDNISolone  (MEDROL  DOSEPAK) 4 MG TBPK tablet, Take by mouth daily. Follow package instructions, Disp: 21 tablet, Rfl: 0   ranitidine  (ZANTAC ) 150 MG tablet, Take 1 tablet (150 mg total) by mouth 2 (two) times daily. (Patient not taking: No sig reported), Disp: 60 tablet, Rfl: 3   tiZANidine  (ZANAFLEX ) 4 MG tablet, Take 1 tablet (4 mg total) by mouth every 8 (eight) hours as needed for muscle spasms., Disp: 30 tablet, Rfl: 0  Current Facility-Administered Medications:    0.9 %  sodium chloride  infusion, 500 mL, Intravenous, Once, Danis, Victory CROME III, MD   Medications ordered in this encounter:  Meds ordered this encounter  Medications   metroNIDAZOLE  (METROGEL ) 0.75 % vaginal gel    Sig: Place 1 Applicatorful vaginally at bedtime for 5 days.    Dispense:  50 g    Refill:  0    Supervising Provider:    BLAISE ALEENE KIDD [[id number]]     *If you need refills on other medications prior to your next appointment, please contact your pharmacy*  Follow-Up: Call back or seek an in-person evaluation if the symptoms worsen or if the condition fails to improve as anticipated.  [facility] [phone]  Other Instructions  -avoid scent soaps and lotions  -wash with mild soap and water -avoid douches and vaginal sprays and wipes -pick up boric acid suppositories and use every other day of the week (like Monday, Wednesday, and Friday) to help balance your pH levels -change under garments daily -wear cotton and or breathable underwear to bed   If you have been instructed to have an in-person evaluation today at a local Urgent Care facility, please use the link below. It will take you to a list of all of our available [facility], including address, phone number and hours of operation. Please do not delay care.  [facility]  If you or a family member do not have a primary care provider, use the link below to schedule a visit and establish care. When you choose a [facility] primary care physician or advanced practice provider, you gain a long-term partner in health. Find a Primary Care Provider  Learn more about [facility] in-office and virtual care options:  - Get Care Now

## 2024-10-02 NOTE — Progress Notes (Signed)
 Virtual Visit Consent   Sheila Scott, you are scheduled for a virtual visit with a [facility] provider today. Just as with appointments in the office, your consent must be obtained to participate. Your consent will be active for this visit and any virtual visit you may have with one of our providers in the next 365 days. If you have a MyChart account, a copy of this consent can be sent to you electronically.  As this is a virtual visit, video technology does not allow for your provider to perform a traditional examination. This may limit your provider's ability to fully assess your condition. If your provider identifies any concerns that need to be evaluated in person or the need to arrange testing (such as labs, EKG, etc.), we will make arrangements to do so. Although advances in technology are sophisticated, we cannot ensure that it will always work on either your end or our end. If the connection with a video visit is poor, the visit may have to be switched to a telephone visit. With either a video or telephone visit, we are not always able to ensure that we have a secure connection.  By engaging in this virtual visit, you consent to the provision of healthcare and authorize for your insurance to be billed (if applicable) for the services provided during this visit. Depending on your insurance coverage, you may receive a charge related to this service.  I need to obtain your verbal consent now. Are you willing to proceed with your visit today? Sheila Scott has provided verbal consent on 10/02/2024 for a virtual visit (video or telephone). Chiquita CHRISTELLA Barefoot, NP  Date: 10/02/2024 1:26 PM   Virtual Visit via Video Note   I, Chiquita CHRISTELLA Barefoot, connected with  Sheila Scott  ([id number], 05/01/1976) on 10/02/24 at  1:30 PM EDT by a video-enabled telemedicine application and verified that I am speaking with the correct person using two identifiers.  Location: Patient: Virtual Visit  Location Patient: Home Provider: Virtual Visit Location Provider: Home Office   I discussed the limitations of evaluation and management by telemedicine and the availability of in person appointments. The patient expressed understanding and agreed to proceed.    History of Present Illness: Sheila Scott is a 48 y.o. who identifies as a female who was assigned female at birth, and is being seen today for BV  Was having some discharge, but cycle started and now can't tell if BV was happening.  Has had BV in past used boric supp and it helped a lot. Partner is positive for BV and was advised to get treated so they would not pass it back and forth. [facility] for [facility]. Denies odor, pelvic pain, back pain, fevers, chills, n/v, or UTI symptoms or change bowel or bladder habits.  Problems:  Patient Active Problem List   Diagnosis Date Noted   Exposure to COVID-19 virus 06/17/2019   OTH ABNORMAL PAPANICOLAOU SMEAR CERVIX&CERV HPV 02/16/2011   HYPERLIPIDEMIA 02/14/2011   TOBACCO USE 02/14/2011   HYPERGLYCEMIA, FASTING 02/14/2011    Allergies: No Known Allergies Medications:  Current Outpatient Medications:    dexlansoprazole  (DEXILANT ) 60 MG capsule, Take 1 capsule (60 mg total) by mouth daily. (Patient not taking: No sig reported), Disp: 90 capsule, Rfl: 3   ibuprofen  (ADVIL ) 600 MG tablet, Take 1 tablet (600 mg total) by mouth every 6 (six) hours as needed., Disp: 30 tablet, Rfl: 0   methylPREDNISolone  (MEDROL  DOSEPAK) 4  MG TBPK tablet, Take by mouth daily. Follow package instructions, Disp: 21 tablet, Rfl: 0   ranitidine  (ZANTAC ) 150 MG tablet, Take 1 tablet (150 mg total) by mouth 2 (two) times daily. (Patient not taking: No sig reported), Disp: 60 tablet, Rfl: 3   tiZANidine  (ZANAFLEX ) 4 MG tablet, Take 1 tablet (4 mg total) by mouth every 8 (eight) hours as needed for muscle spasms., Disp: 30 tablet, Rfl: 0  Current Facility-Administered Medications:    0.9 %  sodium  chloride infusion, 500 mL, Intravenous, Once, Danis, Victory CROME III, MD  Observations/Objective: Patient is well-developed, well-nourished in no acute distress.  Resting comfortably  at home.  Head is normocephalic, atraumatic.  No labored breathing.  Speech is clear and coherent with logical content.  Patient is alert and oriented at baseline.    Assessment and Plan:   1. BV (bacterial vaginosis) (Primary)  - metroNIDAZOLE  (METROGEL ) 0.75 % vaginal gel; Place 1 Applicatorful vaginally at bedtime for 5 days.  Dispense: 50 g; Refill: 0   -avoid scent soaps and lotions  -wash with mild soap and water -avoid douches and vaginal sprays and wipes -pick up boric acid suppositories and use every other day of the week (like Monday, Wednesday, and Friday) to help balance your pH levels -change under garments daily -wear cotton and or breathable underwear to bed   Reviewed side effects, risks and benefits of medication.    Patient acknowledged agreement and understanding of the plan.   Past Medical, Surgical, Social History, Allergies, and Medications have been Reviewed.   Follow Up Instructions: I discussed the assessment and treatment plan with the patient. The patient was provided an opportunity to ask questions and all were answered. The patient agreed with the plan and demonstrated an understanding of the instructions.  A copy of instructions were sent to the patient via MyChart unless otherwise noted below.    The patient was advised to call back or seek an in-person evaluation if the symptoms worsen or if the condition fails to improve as anticipated.    Chiquita CHRISTELLA Barefoot, NP
